# Patient Record
Sex: Female | Born: 1995 | Marital: Single | Smoking: Never smoker
Health system: Southern US, Community
[De-identification: ages and names within clinical notes are randomized; demographics above are authoritative.]

---

## 2017-02-19 DIAGNOSIS — B351 Tinea unguium: Secondary | ICD-10-CM | POA: Diagnosis not present

## 2017-02-19 DIAGNOSIS — B353 Tinea pedis: Secondary | ICD-10-CM | POA: Diagnosis not present

## 2018-01-02 DIAGNOSIS — J0100 Acute maxillary sinusitis, unspecified: Secondary | ICD-10-CM | POA: Diagnosis not present

## 2018-04-03 DIAGNOSIS — R51 Headache: Secondary | ICD-10-CM | POA: Diagnosis not present

## 2018-04-03 DIAGNOSIS — K629 Disease of anus and rectum, unspecified: Secondary | ICD-10-CM | POA: Diagnosis not present

## 2018-04-20 DIAGNOSIS — N926 Irregular menstruation, unspecified: Secondary | ICD-10-CM | POA: Diagnosis not present

## 2018-08-29 DIAGNOSIS — B373 Candidiasis of vulva and vagina: Secondary | ICD-10-CM | POA: Diagnosis not present

## 2018-11-12 DIAGNOSIS — Z6823 Body mass index (BMI) 23.0-23.9, adult: Secondary | ICD-10-CM | POA: Diagnosis not present

## 2018-11-12 DIAGNOSIS — K30 Functional dyspepsia: Secondary | ICD-10-CM | POA: Diagnosis not present

## 2020-04-06 ENCOUNTER — Other Ambulatory Visit (HOSPITAL_COMMUNITY)
Admission: RE | Admit: 2020-04-06 | Discharge: 2020-04-06 | Disposition: A | Discharge status: Home / Self Care | Payer: Medicaid Other | Source: Ambulatory Visit | Attending: Family Medicine | Admitting: Family Medicine

## 2020-04-06 ENCOUNTER — Encounter: Payer: Self-pay | Admitting: Family Medicine

## 2020-04-06 ENCOUNTER — Other Ambulatory Visit: Payer: Self-pay

## 2020-04-06 ENCOUNTER — Ambulatory Visit (INDEPENDENT_AMBULATORY_CARE_PROVIDER_SITE_OTHER): Payer: Medicaid Other | Admitting: Family Medicine

## 2020-04-06 VITALS — BP 109/58 | HR 78 | Ht 59.0 in | Wt 125.0 lb

## 2020-04-06 DIAGNOSIS — Z3400 Encounter for supervision of normal first pregnancy, unspecified trimester: Secondary | ICD-10-CM | POA: Insufficient documentation

## 2020-04-06 DIAGNOSIS — Z3A16 16 weeks gestation of pregnancy: Secondary | ICD-10-CM | POA: Insufficient documentation

## 2020-04-06 DIAGNOSIS — Z3402 Encounter for supervision of normal first pregnancy, second trimester: Secondary | ICD-10-CM

## 2020-04-06 LAB — POCT URINALYSIS DIPSTICK
Blood, UA: NEGATIVE
Glucose, UA: NEGATIVE
Ketones, UA: NEGATIVE
Leukocytes, UA: NEGATIVE
Nitrite, UA: NEGATIVE
Protein, UA: NEGATIVE
Spec Grav, UA: 1.015 (ref 1.010–1.025)
pH, UA: 6 (ref 5.0–8.0)

## 2020-04-06 NOTE — Progress Notes (Signed)
Subjective:  Jacqueline Booker is a G1P0 [redacted]w[redacted]d being seen today for her first obstetrical visit.  Her obstetrical history is significant for first pregnancy. Patient does intend to breast feed. Pregnancy history fully reviewed.  Patient reports no complaints.  BP (!) 109/58   Pulse 78   Ht 4\' 11"  (1.499 m)   Wt 125 lb 0.6 oz (56.7 kg)   LMP 12/26/2019 (Within Days)   BMI 25.26 kg/m   HISTORY: OB History  Gravida Para Term Preterm AB Living  1            SAB TAB Ectopic Multiple Live Births               # Outcome Date GA Lbr Len/2nd Weight Sex Delivery Anes PTL Lv  1 Current             History reviewed. No pertinent past medical history.  History reviewed. No pertinent surgical history.  Family History  Problem Relation Age of Onset  . High Cholesterol Mother   . High Cholesterol Father      Exam  BP (!) 109/58   Pulse 78   Ht 4\' 11"  (1.499 m)   Wt 125 lb 0.6 oz (56.7 kg)   LMP 12/26/2019 (Within Days)   BMI 25.26 kg/m   Chaperone present during exam  CONSTITUTIONAL: Well-developed, well-nourished female in no acute distress.  HENT:  Normocephalic, atraumatic, External right and left ear normal. Oropharynx is clear and moist EYES: Conjunctivae and EOM are normal. Pupils are equal, round, and reactive to light. No scleral icterus.  NECK: Normal range of motion, supple, no masses.  Normal thyroid.  CARDIOVASCULAR: Normal heart rate noted, regular rhythm RESPIRATORY: Clear to auscultation bilaterally. Effort and breath sounds normal, no problems with respiration noted. BREASTS: Symmetric in size. No masses, skin changes, nipple drainage, or lymphadenopathy. ABDOMEN: Soft, normal bowel sounds, no distention noted.  No tenderness, rebound or guarding.  PELVIC: Normal appearing external genitalia; normal appearing vaginal mucosa and cervix. No abnormal discharge noted. Normal uterine size, no other palpable masses, no uterine or adnexal tenderness. MUSCULOSKELETAL:  Normal range of motion. No tenderness.  No cyanosis, clubbing, or edema.  2+ distal pulses. SKIN: Skin is warm and dry. No rash noted. Not diaphoretic. No erythema. No pallor. NEUROLOGIC: Alert and oriented to person, place, and time. Normal reflexes, muscle tone coordination. No cranial nerve deficit noted. PSYCHIATRIC: Normal mood and affect. Normal behavior. Normal judgment and thought content.    Assessment:    Pregnancy: G1P0 Patient Active Problem List   Diagnosis Date Noted  . Supervision of normal first pregnancy, antepartum 04/06/2020      Plan:   1. [redacted] weeks gestation of pregnancy - CBC/D/Plt+RPR+Rh+ABO+Rub Ab... - Hemoglobpathy+Fer w/A Thal Rfx - Culture, OB Urine - 02/25/2020 MFM OB COMP + 14 WK; Future - SMN1 COPY NUMBER ANALYSIS (SMA Carrier Screen) - [facility] AMB BABYSCRIPTS SCHEDULE OPTIMIZATION - POCT urinalysis dipstick - Cytology - PAP( [place]) - AFP, Serum, Open Spina Bifida - Genetic Screening  2. Supervision of normal first pregnancy, antepartum FHT normal. Fundal height around 16cm.  Discussed practice arrangement, delivery at [facility] Desires panorama  - CBC/D/Plt+RPR+Rh+ABO+Rub Ab... - Hemoglobpathy+Fer w/A Thal Rfx - Culture, OB Urine - [place] MFM OB COMP + 14 WK; Future - SMN1 COPY NUMBER ANALYSIS (SMA Carrier Screen) - CHL AMB BABYSCRIPTS SCHEDULE OPTIMIZATION - POCT urinalysis dipstick - Cytology - PAP( [place]) - AFP, Serum, Open Spina Bifida - Genetic Screening  Problem list reviewed and updated. 75% of 30 min visit spent on counseling and coordination of care.     Levie Heritage 04/06/2020

## 2020-04-08 LAB — CULTURE, OB URINE

## 2020-04-08 LAB — URINE CULTURE, OB REFLEX

## 2020-04-12 LAB — CYTOLOGY - PAP
Chlamydia: NEGATIVE
Comment: NEGATIVE
Comment: NEGATIVE
Comment: NORMAL
Diagnosis: UNDETERMINED — AB
High risk HPV: NEGATIVE
Neisseria Gonorrhea: NEGATIVE

## 2020-04-13 ENCOUNTER — Telehealth: Payer: Self-pay

## 2020-04-13 NOTE — Telephone Encounter (Signed)
Pt made aware that her Panorama was normal.Pt does not want to know the gender. A copy of the results was put in a envelope for the pt. Pt also made aware that her Pap smear showed ASCUS and she will need to repeat her pap in 1 year. Understanding was voiced.  Wah Sabic l Leelah Hanna, CMA

## 2020-04-13 NOTE — Telephone Encounter (Signed)
Called pt to discuss Panorama results. Left message for pt to call the office back. °Delesha Pohlman l Khaila Velarde, CMA  °

## 2020-04-14 LAB — CBC/D/PLT+RPR+RH+ABO+RUB AB...
Antibody Screen: NEGATIVE
Basophils Absolute: 0 10*3/uL (ref 0.0–0.2)
Basos: 0 %
EOS (ABSOLUTE): 0 10*3/uL (ref 0.0–0.4)
Eos: 0 %
HCV Ab: <0.1 s/co ratio (ref 0.0–0.9)
HIV Screen 4th Generation wRfx: NONREACTIVE
Hematocrit: 37.9 % (ref 34.0–46.6)
Hemoglobin: 12.8 g/dL (ref 11.1–15.9)
Hepatitis B Surface Ag: NEGATIVE
Immature Grans (Abs): 0.1 10*3/uL (ref 0.0–0.1)
Immature Granulocytes: 1 %
Lymphocytes Absolute: 2.4 10*3/uL (ref 0.7–3.1)
Lymphs: 28 %
MCH: 29.6 pg (ref 26.6–33.0)
MCHC: 33.8 g/dL (ref 31.5–35.7)
MCV: 88 fL (ref 79–97)
Monocytes Absolute: 0.6 10*3/uL (ref 0.1–0.9)
Monocytes: 7 %
Neutrophils Absolute: 5.3 10*3/uL (ref 1.4–7.0)
Neutrophils: 64 %
Platelets: 244 10*3/uL (ref 150–450)
RBC: 4.32 x10E6/uL (ref 3.77–5.28)
RDW: 13.5 % (ref 11.7–15.4)
RPR Ser Ql: NONREACTIVE
Rh Factor: POSITIVE
Rubella Antibodies, IGG: 3.92 index (ref >0.99)
WBC: 8.4 10*3/uL (ref 3.4–10.8)

## 2020-04-14 LAB — SMN1 COPY NUMBER ANALYSIS (SMA CARRIER SCREENING)

## 2020-04-14 LAB — HEMOGLOBPATHY+FER W/A THAL RFX
Ferritin: 22 ng/mL (ref 15–150)
Hgb A2: 3 % (ref 1.8–3.2)
Hgb A: 97 % (ref 96.4–98.8)
Hgb F: 0 % (ref 0.0–2.0)
Hgb S: 0 %

## 2020-04-14 LAB — HCV INTERPRETATION

## 2020-04-14 LAB — AFP, SERUM, OPEN SPINA BIFIDA
AFP MoM: 1.05
AFP Value: 42.3 ng/mL
Gest. Age on Collection Date: 16.3 weeks
Maternal Age At EDD: 24.9 yr
OSBR Risk 1 IN: 10000
Test Results:: NEGATIVE
Weight: 125 [lb_av]

## 2020-05-03 ENCOUNTER — Ambulatory Visit: Payer: Medicaid Other

## 2020-05-03 ENCOUNTER — Other Ambulatory Visit: Payer: Self-pay

## 2020-05-03 ENCOUNTER — Other Ambulatory Visit: Payer: Self-pay | Admitting: Family Medicine

## 2020-05-03 DIAGNOSIS — Z3689 Encounter for other specified antenatal screening: Secondary | ICD-10-CM

## 2020-05-04 ENCOUNTER — Encounter: Payer: Medicaid Other | Admitting: Family Medicine

## 2020-05-04 ENCOUNTER — Ambulatory Visit (INDEPENDENT_AMBULATORY_CARE_PROVIDER_SITE_OTHER): Payer: Medicaid Other | Admitting: Family Medicine

## 2020-05-04 VITALS — BP 112/58 | HR 79

## 2020-05-04 DIAGNOSIS — Z3A20 20 weeks gestation of pregnancy: Secondary | ICD-10-CM

## 2020-05-04 DIAGNOSIS — Z3400 Encounter for supervision of normal first pregnancy, unspecified trimester: Secondary | ICD-10-CM

## 2020-05-04 NOTE — Progress Notes (Signed)
   PRENATAL VISIT NOTE  Subjective:  Jacqueline Booker is a 24 y.o. G1P0 at [redacted]w[redacted]d being seen today for ongoing prenatal care.  She is currently monitored for the following issues for this low-risk pregnancy and has Supervision of normal first pregnancy, antepartum on their problem list.  Patient reports no complaints.  Contractions: Not present. Vag. Bleeding: None.  Movement: Present. Denies leaking of fluid.   The following portions of the patient's history were reviewed and updated as appropriate: allergies, current medications, past family history, past medical history, past social history, past surgical history and problem list.   Objective:   Vitals:   05/04/20 1307  BP: (!) 112/58  Pulse: 79    Fetal Status: Fetal Heart Rate (bpm): 140   Movement: Present     General:  Alert, oriented and cooperative. Patient is in no acute distress.  Skin: Skin is warm and dry. No rash noted.   Cardiovascular: Normal heart rate noted  Respiratory: Normal respiratory effort, no problems with respiration noted  Abdomen: Soft, gravid, appropriate for gestational age.  Pain/Pressure: Present     Pelvic: Cervical exam deferred        Extremities: Normal range of motion.  Edema: None  Mental Status: Normal mood and affect. Normal behavior. Normal judgment and thought content.   Assessment and Plan:  Pregnancy: G1P0 at [redacted]w[redacted]d 1. Supervision of normal first pregnancy, antepartum FHT and FH normal  2. [redacted] weeks gestation of pregnancy  Preterm labor symptoms and general obstetric precautions including but not limited to vaginal bleeding, contractions, leaking of fluid and fetal movement were reviewed in detail with the patient. Please refer to After Visit Summary for other counseling recommendations.   No follow-ups on file.  Future Appointments  Date Time Provider Department Center  05/10/2020  2:45 PM [facility] US4 [facility]  06/01/2020  3:30 PM Levie Heritage, DO [facility] None    Levie Heritage, DO

## 2020-05-10 ENCOUNTER — Ambulatory Visit: Payer: Medicaid Other | Attending: Family Medicine

## 2020-05-10 ENCOUNTER — Other Ambulatory Visit: Payer: Self-pay

## 2020-05-10 DIAGNOSIS — Z3689 Encounter for other specified antenatal screening: Secondary | ICD-10-CM

## 2020-05-11 ENCOUNTER — Other Ambulatory Visit: Payer: Self-pay | Admitting: *Deleted

## 2020-05-11 DIAGNOSIS — Z362 Encounter for other antenatal screening follow-up: Secondary | ICD-10-CM

## 2020-06-01 ENCOUNTER — Ambulatory Visit (INDEPENDENT_AMBULATORY_CARE_PROVIDER_SITE_OTHER): Payer: Medicaid Other | Admitting: Family Medicine

## 2020-06-01 VITALS — BP 117/65 | HR 84 | Wt 137.0 lb

## 2020-06-01 DIAGNOSIS — Z3A24 24 weeks gestation of pregnancy: Secondary | ICD-10-CM

## 2020-06-01 DIAGNOSIS — Z3400 Encounter for supervision of normal first pregnancy, unspecified trimester: Secondary | ICD-10-CM

## 2020-06-01 NOTE — Progress Notes (Signed)
   PRENATAL VISIT NOTE  Subjective:  Jacqueline Booker is a 24 y.o. G1P0 at [redacted]w[redacted]d being seen today for ongoing prenatal care.  She is currently monitored for the following issues for this low-risk pregnancy and has Supervision of normal first pregnancy, antepartum on their problem list.  Patient reports no complaints.  Contractions: Not present. Vag. Bleeding: None.  Movement: Present. Denies leaking of fluid.   The following portions of the patient's history were reviewed and updated as appropriate: allergies, current medications, past family history, past medical history, past social history, past surgical history and problem list.   Objective:   Vitals:   06/01/20 1537  BP: 117/65  Pulse: 84  Weight: 137 lb (62.1 kg)    Fetal Status: Fetal Heart Rate (bpm): 142 Fundal Height: 24 cm Movement: Present     General:  Alert, oriented and cooperative. Patient is in no acute distress.  Skin: Skin is warm and dry. No rash noted.   Cardiovascular: Normal heart rate noted  Respiratory: Normal respiratory effort, no problems with respiration noted  Abdomen: Soft, gravid, appropriate for gestational age.  Pain/Pressure: Present     Pelvic: Cervical exam deferred        Extremities: Normal range of motion.  Edema: None  Mental Status: Normal mood and affect. Normal behavior. Normal judgment and thought content.   Assessment and Plan:  Pregnancy: G1P0 at [redacted]w[redacted]d 1. [redacted] weeks gestation of pregnancy 2. Supervision of normal first pregnancy, antepartum FHT and FH normal  Preterm labor symptoms and general obstetric precautions including but not limited to vaginal bleeding, contractions, leaking of fluid and fetal movement were reviewed in detail with the patient. Please refer to After Visit Summary for other counseling recommendations.   Return in about 4 weeks (around 06/29/2020) for 2 hr GTT, OB f/u.  Future Appointments  Date Time Provider Department Center  06/07/2020  3:45 PM [facility] US4  [facility]    Levie Heritage, DO

## 2020-06-07 ENCOUNTER — Ambulatory Visit: Payer: 59 | Admitting: *Deleted

## 2020-06-07 ENCOUNTER — Other Ambulatory Visit: Payer: Self-pay

## 2020-06-07 ENCOUNTER — Ambulatory Visit: Payer: 59 | Attending: Obstetrics and Gynecology

## 2020-06-07 DIAGNOSIS — Z3A25 25 weeks gestation of pregnancy: Secondary | ICD-10-CM

## 2020-06-07 DIAGNOSIS — Z3400 Encounter for supervision of normal first pregnancy, unspecified trimester: Secondary | ICD-10-CM | POA: Diagnosis present

## 2020-06-07 DIAGNOSIS — Z362 Encounter for other antenatal screening follow-up: Secondary | ICD-10-CM | POA: Diagnosis present

## 2020-06-14 ENCOUNTER — Telehealth: Payer: Self-pay

## 2020-06-14 NOTE — Telephone Encounter (Signed)
Pt called stating she felt dizzy and faint. Pt states her BP is fine. Pt denies seeing spots. Pt states she has had dizzy episodes before she became pregnant. Advised pt to go to [facility] if she has another episode today. Understanding was voiced. Yu Cragun l Zafiro Routson, CMA

## 2020-06-29 ENCOUNTER — Encounter: Payer: Medicaid Other | Admitting: Family Medicine

## 2020-06-30 ENCOUNTER — Ambulatory Visit (INDEPENDENT_AMBULATORY_CARE_PROVIDER_SITE_OTHER): Payer: 59 | Admitting: Family Medicine

## 2020-06-30 ENCOUNTER — Other Ambulatory Visit: Payer: Self-pay

## 2020-06-30 VITALS — BP 124/63 | HR 77 | Wt 136.0 lb

## 2020-06-30 DIAGNOSIS — Z23 Encounter for immunization: Secondary | ICD-10-CM | POA: Diagnosis not present

## 2020-06-30 DIAGNOSIS — Z3A28 28 weeks gestation of pregnancy: Secondary | ICD-10-CM

## 2020-06-30 DIAGNOSIS — Z3400 Encounter for supervision of normal first pregnancy, unspecified trimester: Secondary | ICD-10-CM

## 2020-06-30 NOTE — Progress Notes (Signed)
   PRENATAL VISIT NOTE  Subjective:  Jacqueline Booker is a 24 y.o. G1P0 at [redacted]w[redacted]d being seen today for ongoing prenatal care.  She is currently monitored for the following issues for this low-risk pregnancy and has Supervision of normal first pregnancy, antepartum on their problem list.  Patient reports mood irritation. Bleeding hemorrhoids.  Contractions: Not present. Vag. Bleeding: None.  Movement: Present. Denies leaking of fluid.   The following portions of the patient's history were reviewed and updated as appropriate: allergies, current medications, past family history, past medical history, past social history, past surgical history and problem list.   Objective:   Vitals:   06/30/20 0939  BP: 124/63  Pulse: 77  Weight: 136 lb (61.7 kg)    Fetal Status: Fetal Heart Rate (bpm): 135 Fundal Height: 27 cm Movement: Present     General:  Alert, oriented and cooperative. Patient is in no acute distress.  Skin: Skin is warm and dry. No rash noted.   Cardiovascular: Normal heart rate noted  Respiratory: Normal respiratory effort, no problems with respiration noted  Abdomen: Soft, gravid, appropriate for gestational age.  Pain/Pressure: Present     Pelvic: Cervical exam deferred        Extremities: Normal range of motion.  Edema: None  Mental Status: Normal mood and affect. Normal behavior. Normal judgment and thought content.   Assessment and Plan:  Pregnancy: G1P0 at [redacted]w[redacted]d 1. Supervision of normal first pregnancy, antepartum FHT and FH normal. 28 week labs Preparation H for hemorrhoids Discussed mood changes. Patient okay for now. Will notify if worsens. - Glucose Tolerance, 2 Hours w/1 Hour - CBC - HIV antibody (with reflex) - RPR  2. [redacted] weeks gestation of pregnancy - Glucose Tolerance, 2 Hours w/1 Hour - CBC - HIV antibody (with reflex) - RPR  Preterm labor symptoms and general obstetric precautions including but not limited to vaginal bleeding, contractions, leaking of  fluid and fetal movement were reviewed in detail with the patient. Please refer to After Visit Summary for other counseling recommendations.   Return in about 2 weeks (around 07/14/2020).  Future Appointments  Date Time Provider Department Center  07/10/2020 11:00 AM Adam Phenix, MD [facility] None    Levie Heritage, DO

## 2020-06-30 NOTE — Progress Notes (Signed)
Patient doing 2 hr gtt and 28 week  labs today. Armandina Stammer RN

## 2020-07-01 LAB — CBC
Hematocrit: 34.9 % (ref 34.0–46.6)
Hemoglobin: 11.6 g/dL (ref 11.1–15.9)
MCH: 28.5 pg (ref 26.6–33.0)
MCHC: 33.2 g/dL (ref 31.5–35.7)
MCV: 86 fL (ref 79–97)
Platelets: 236 10*3/uL (ref 150–450)
RBC: 4.07 x10E6/uL (ref 3.77–5.28)
RDW: 11.8 % (ref 11.7–15.4)
WBC: 7.1 10*3/uL (ref 3.4–10.8)

## 2020-07-01 LAB — GLUCOSE TOLERANCE, 2 HOURS W/ 1HR
Glucose, 1 hour: 135 mg/dL (ref 65–179)
Glucose, 2 hour: 121 mg/dL (ref 65–152)
Glucose, Fasting: 69 mg/dL (ref 65–91)

## 2020-07-01 LAB — RPR: RPR Ser Ql: NONREACTIVE

## 2020-07-01 LAB — HIV ANTIBODY (ROUTINE TESTING W REFLEX): HIV Screen 4th Generation wRfx: NONREACTIVE

## 2020-07-10 ENCOUNTER — Ambulatory Visit (INDEPENDENT_AMBULATORY_CARE_PROVIDER_SITE_OTHER): Payer: 59 | Admitting: Obstetrics & Gynecology

## 2020-07-10 ENCOUNTER — Other Ambulatory Visit: Payer: Self-pay

## 2020-07-10 VITALS — BP 110/61 | HR 87 | Wt 140.0 lb

## 2020-07-10 DIAGNOSIS — Z3400 Encounter for supervision of normal first pregnancy, unspecified trimester: Secondary | ICD-10-CM

## 2020-07-10 DIAGNOSIS — Z3A30 30 weeks gestation of pregnancy: Secondary | ICD-10-CM

## 2020-07-10 NOTE — Progress Notes (Signed)
   PRENATAL VISIT NOTE  Subjective:  Jacqueline Booker is a 24 y.o. G1P0 at [redacted]w[redacted]d being seen today for ongoing prenatal care.  She is currently monitored for the following issues for this low-risk pregnancy and has Supervision of normal first pregnancy, antepartum on their problem list.  Patient reports she has cravings for dust and dirt but has resisted ingesting this.  Contractions: Not present. Vag. Bleeding: None.  Movement: Present. Denies leaking of fluid.   The following portions of the patient's history were reviewed and updated as appropriate: allergies, current medications, past family history, past medical history, past social history, past surgical history and problem list.   Objective:   Vitals:   07/10/20 1105  BP: 110/61  Pulse: 87  Weight: 140 lb (63.5 kg)    Fetal Status: Fetal Heart Rate (bpm): 136   Movement: Present     General:  Alert, oriented and cooperative. Patient is in no acute distress.  Skin: Skin is warm and dry. No rash noted.   Cardiovascular: Normal heart rate noted  Respiratory: Normal respiratory effort, no problems with respiration noted  Abdomen: Soft, gravid, appropriate for gestational age.  Pain/Pressure: Absent     Pelvic: Cervical exam deferred        Extremities: Normal range of motion.  Edema: None  Mental Status: Normal mood and affect. Normal behavior. Normal judgment and thought content.   Assessment and Plan:  Pregnancy: G1P0 at [redacted]w[redacted]d 1. [redacted] weeks gestation of pregnancy At risk for pica CBC    Component Value Date/Time   WBC 7.1 06/30/2020 1003   RBC 4.07 06/30/2020 1003   HGB 11.6 06/30/2020 1003   HCT 34.9 06/30/2020 1003   PLT 236 06/30/2020 1003   MCV 86 06/30/2020 1003   MCH 28.5 06/30/2020 1003   MCHC 33.2 06/30/2020 1003   RDW 11.8 06/30/2020 1003   LYMPHSABS 2.4 04/06/2020 1422   EOSABS 0.0 04/06/2020 1422   BASOSABS 0.0 04/06/2020 1422     2. Supervision of normal first pregnancy, antepartum Had nl f/u US last  mo.  Preterm labor symptoms and general obstetric precautions including but not limited to vaginal bleeding, contractions, leaking of fluid and fetal movement were reviewed in detail with the patient. Please refer to After Visit Summary for other counseling recommendations.   Return in about 2 weeks (around 07/24/2020).  No future appointments.  Scheryl Darter, MD

## 2020-07-10 NOTE — Patient Instructions (Signed)

## 2020-07-24 ENCOUNTER — Encounter: Payer: Self-pay | Admitting: Obstetrics & Gynecology

## 2020-07-24 ENCOUNTER — Other Ambulatory Visit: Payer: Self-pay

## 2020-07-24 ENCOUNTER — Encounter: Payer: 59 | Admitting: Obstetrics & Gynecology

## 2020-07-24 ENCOUNTER — Ambulatory Visit (INDEPENDENT_AMBULATORY_CARE_PROVIDER_SITE_OTHER): Payer: 59 | Admitting: Obstetrics & Gynecology

## 2020-07-24 VITALS — BP 117/69 | HR 87 | Wt 143.0 lb

## 2020-07-24 DIAGNOSIS — Z3400 Encounter for supervision of normal first pregnancy, unspecified trimester: Secondary | ICD-10-CM

## 2020-07-24 DIAGNOSIS — Z3A32 32 weeks gestation of pregnancy: Secondary | ICD-10-CM

## 2020-07-24 NOTE — Progress Notes (Signed)
   PRENATAL VISIT NOTE  Subjective:  Jacqueline Booker is a 24 y.o. G1P0 at [redacted]w[redacted]d being seen today for ongoing prenatal care.  She is currently monitored for the following issues for this low-risk pregnancy and has Supervision of normal first pregnancy, antepartum on their problem list.  Patient reports no complaints.  Contractions: Not present. Vag. Bleeding: None.  Movement: Present. Denies leaking of fluid.   The following portions of the patient's history were reviewed and updated as appropriate: allergies, current medications, past family history, past medical history, past social history, past surgical history and problem list.   Objective:   Vitals:   07/24/20 1023  BP: 117/69  Pulse: 87  Weight: 143 lb (64.9 kg)    Fetal Status: Fetal Heart Rate (bpm): 130 Fundal Height: 32 cm Movement: Present     General:  Alert, oriented and cooperative. Patient is in no acute distress.  Skin: Skin is warm and dry. No rash noted.   Cardiovascular: Normal heart rate noted  Respiratory: Normal respiratory effort, no problems with respiration noted  Abdomen: Soft, gravid, appropriate for gestational age.  Pain/Pressure: Present     Pelvic: Cervical exam deferred        Extremities: Normal range of motion.  Edema: None  Mental Status: Normal mood and affect. Normal behavior. Normal judgment and thought content.   Assessment and Plan:  Pregnancy: G1P0 at 104w0d 1. [redacted] weeks gestation of pregnancy 2. Supervision of normal first pregnancy, antepartum No concerns. Doing well on Babyscripts. Preterm labor symptoms and general obstetric precautions including but not limited to vaginal bleeding, contractions, leaking of fluid and fetal movement were reviewed in detail with the patient. Please refer to After Visit Summary for other counseling recommendations.   Return in about 4 weeks (around 08/21/2020) for Pelvic cultures, OFFICE 36 week Babyscripts OB VISIT (MD or APP).  No future  appointments.  Jaynie Collins, MD

## 2020-07-24 NOTE — Patient Instructions (Signed)
Return to office for any scheduled appointments. Call the office or go to the MAU at [facility] [place] if:  You begin to have strong, frequent contractions  Your water breaks.  Sometimes it is a big gush of fluid, sometimes it is just a trickle that keeps getting your panties wet or running down your legs  You have vaginal bleeding.  It is normal to have a small amount of spotting if your cervix was checked.   You do not feel your baby moving like normal.  If you do not, get something to eat and drink and lay down and focus on feeling your baby move.   If your baby is still not moving like normal, you should call the office or go to MAU.  Any other obstetric concerns.   

## 2020-08-16 ENCOUNTER — Ambulatory Visit: Payer: 59

## 2020-08-17 ENCOUNTER — Other Ambulatory Visit (HOSPITAL_COMMUNITY)
Admission: RE | Admit: 2020-08-17 | Discharge: 2020-08-17 | Disposition: A | Discharge status: Home / Self Care | Payer: 59 | Source: Ambulatory Visit | Attending: Family Medicine | Admitting: Family Medicine

## 2020-08-17 ENCOUNTER — Ambulatory Visit (INDEPENDENT_AMBULATORY_CARE_PROVIDER_SITE_OTHER): Payer: 59 | Admitting: Family Medicine

## 2020-08-17 ENCOUNTER — Other Ambulatory Visit: Payer: Self-pay

## 2020-08-17 ENCOUNTER — Encounter: Payer: Self-pay | Admitting: Family Medicine

## 2020-08-17 ENCOUNTER — Encounter: Payer: 59 | Admitting: Family Medicine

## 2020-08-17 VITALS — BP 118/67 | HR 83 | Wt 148.0 lb

## 2020-08-17 DIAGNOSIS — Z3685 Encounter for antenatal screening for Streptococcus B: Secondary | ICD-10-CM | POA: Diagnosis not present

## 2020-08-17 DIAGNOSIS — Z3400 Encounter for supervision of normal first pregnancy, unspecified trimester: Secondary | ICD-10-CM

## 2020-08-17 DIAGNOSIS — Z3A35 35 weeks gestation of pregnancy: Secondary | ICD-10-CM | POA: Insufficient documentation

## 2020-08-17 NOTE — Progress Notes (Signed)
   PRENATAL VISIT NOTE  Subjective:  Jacqueline Booker is a 24 y.o. G1P0 at [redacted]w[redacted]d being seen today for ongoing prenatal care.  She is currently monitored for the following issues for this low-risk pregnancy and has Supervision of normal first pregnancy, antepartum on their problem list.  Patient reports vaginal discharge earlier this week - its going away now..  Contractions: Irregular. Vag. Bleeding: None.  Movement: Present. Denies leaking of fluid.   The following portions of the patient's history were reviewed and updated as appropriate: allergies, current medications, past family history, past medical history, past social history, past surgical history and problem list.   Objective:   Vitals:   08/17/20 1506  BP: 118/67  Pulse: 83  Weight: 148 lb (67.1 kg)    Fetal Status: Fetal Heart Rate (bpm): 123 Fundal Height: 35 cm Movement: Present  Presentation: Vertex  General:  Alert, oriented and cooperative. Patient is in no acute distress.  Skin: Skin is warm and dry. No rash noted.   Cardiovascular: Normal heart rate noted  Respiratory: Normal respiratory effort, no problems with respiration noted  Abdomen: Soft, gravid, appropriate for gestational age.  Pain/Pressure: Present     Pelvic: Cervical exam performed in the presence of a chaperone Dilation: 1.5 Effacement (%): Thick Station: -3  Extremities: Normal range of motion.  Edema: Trace  Mental Status: Normal mood and affect. Normal behavior. Normal judgment and thought content.   Assessment and Plan:  Pregnancy: G1P0 at [redacted]w[redacted]d 1. [redacted] weeks gestation of pregnancy - Cervicovaginal ancillary only( [place]) - Culture, beta strep (group b only)  2. Supervision of normal first pregnancy, antepartum FHT and FH normal  Preterm labor symptoms and general obstetric precautions including but not limited to vaginal bleeding, contractions, leaking of fluid and fetal movement were reviewed in detail with the patient. Please refer to  After Visit Summary for other counseling recommendations.   Return in about 2 weeks (around 08/31/2020) for OB f/u.  No future appointments.  Levie Heritage, DO

## 2020-08-21 LAB — CULTURE, BETA STREP (GROUP B ONLY): Strep Gp B Culture: NEGATIVE

## 2020-08-22 LAB — CERVICOVAGINAL ANCILLARY ONLY
Chlamydia: NEGATIVE
Comment: NEGATIVE
Comment: NORMAL
Neisseria Gonorrhea: NEGATIVE

## 2020-08-23 ENCOUNTER — Encounter: Payer: 59 | Admitting: Family Medicine

## 2020-08-26 NOTE — L&D Delivery Note (Addendum)
Delivery Note Called to bedside and patient complete and pushing. At 2:39 PM a viable female was delivered via Vaginal, Spontaneous (Presentation: Left Occiput Anterior). No nuchal cord noted at delivery.  APGAR: 3, 4; weight 6 lb 7.2 oz (2925 g). Waited 1 min and cord clamped and cut, infant then handed over to the warmer to NICU team. Placenta status: Spontaneous, Intact.  Cord: 3 vessels with the following complications: None.  Cord pH: pending.  Anesthesia: Epidural Episiotomy: None Lacerations: 2nd degree;L and R Sulcus; L Labial Suture Repair: 3.0 vicryl Est. Blood Loss (mL): 778  TXA administered post delivery due to oozing from laceration site. Fundus noted to be firm on exam.  Mom to postpartum.  Baby to NICU.  Cora Collum 09/20/2020, 4:14 PM  GME ATTESTATION:  I saw and evaluated the patient. I agree with the findings and the plan of care as documented in the resident's note.  Alric Seton, MD OB Fellow, [facility], Center for [facility] 09/20/2020 4:37 PM

## 2020-08-31 ENCOUNTER — Ambulatory Visit (INDEPENDENT_AMBULATORY_CARE_PROVIDER_SITE_OTHER): Payer: 59 | Admitting: Family Medicine

## 2020-08-31 ENCOUNTER — Other Ambulatory Visit: Payer: Self-pay

## 2020-08-31 VITALS — BP 124/55 | HR 81 | Wt 149.0 lb

## 2020-08-31 DIAGNOSIS — Z3400 Encounter for supervision of normal first pregnancy, unspecified trimester: Secondary | ICD-10-CM

## 2020-08-31 DIAGNOSIS — Z3A37 37 weeks gestation of pregnancy: Secondary | ICD-10-CM

## 2020-08-31 NOTE — Progress Notes (Signed)
   PRENATAL VISIT NOTE  Subjective:  Jacqueline Booker is a 25 y.o. G1P0 at [redacted]w[redacted]d being seen today for ongoing prenatal care.  She is currently monitored for the following issues for this low-risk pregnancy and has Supervision of normal first pregnancy, antepartum on their problem list.  Patient reports occasional contractions.  Contractions: Irregular. Vag. Bleeding: None.  Movement: Present. Denies leaking of fluid.   The following portions of the patient's history were reviewed and updated as appropriate: allergies, current medications, past family history, past medical history, past social history, past surgical history and problem list.   Objective:   Vitals:   08/31/20 1318  BP: (!) 124/55  Pulse: 81  Weight: 149 lb (67.6 kg)    Fetal Status: Fetal Heart Rate (bpm): 140   Movement: Present     General:  Alert, oriented and cooperative. Patient is in no acute distress.  Skin: Skin is warm and dry. No rash noted.   Cardiovascular: Normal heart rate noted  Respiratory: Normal respiratory effort, no problems with respiration noted  Abdomen: Soft, gravid, appropriate for gestational age.  Pain/Pressure: Present     Pelvic: Cervical exam deferred        Extremities: Normal range of motion.  Edema: Trace  Mental Status: Normal mood and affect. Normal behavior. Normal judgment and thought content.   Assessment and Plan:  Pregnancy: G1P0 at [redacted]w[redacted]d 1. Supervision of normal first pregnancy, antepartum FHT and FH normal  2. [redacted] weeks gestation of pregnancy   Term labor symptoms and general obstetric precautions including but not limited to vaginal bleeding, contractions, leaking of fluid and fetal movement were reviewed in detail with the patient. Please refer to After Visit Summary for other counseling recommendations.   Return in about 1 week (around 09/07/2020) for OB f/u.  No future appointments.  Levie Heritage, DO

## 2020-09-07 ENCOUNTER — Other Ambulatory Visit: Payer: Self-pay

## 2020-09-07 ENCOUNTER — Ambulatory Visit (INDEPENDENT_AMBULATORY_CARE_PROVIDER_SITE_OTHER): Payer: 59 | Admitting: Family Medicine

## 2020-09-07 VITALS — BP 115/64 | HR 96 | Wt 152.0 lb

## 2020-09-07 DIAGNOSIS — Z3A38 38 weeks gestation of pregnancy: Secondary | ICD-10-CM

## 2020-09-07 DIAGNOSIS — Z3400 Encounter for supervision of normal first pregnancy, unspecified trimester: Secondary | ICD-10-CM

## 2020-09-07 NOTE — Progress Notes (Signed)
   PRENATAL VISIT NOTE  Subjective:  Jacqueline Booker is a 25 y.o. G1P0 at [redacted]w[redacted]d being seen today for ongoing prenatal care.  She is currently monitored for the following issues for this low-risk pregnancy and has Supervision of normal first pregnancy, antepartum on their problem list.  Patient reports no complaints.  Contractions: Irregular. Vag. Bleeding: None.  Movement: Present. Denies leaking of fluid.   The following portions of the patient's history were reviewed and updated as appropriate: allergies, current medications, past family history, past medical history, past social history, past surgical history and problem list.   Objective:   Vitals:   09/07/20 1037  BP: 115/64  Pulse: 96  Weight: 152 lb (68.9 kg)    Fetal Status: Fetal Heart Rate (bpm): 125   Movement: Present     General:  Alert, oriented and cooperative. Patient is in no acute distress.  Skin: Skin is warm and dry. No rash noted.   Cardiovascular: Normal heart rate noted  Respiratory: Normal respiratory effort, no problems with respiration noted  Abdomen: Soft, gravid, appropriate for gestational age.  Pain/Pressure: Present     Pelvic: Cervical exam deferred        Extremities: Normal range of motion.  Edema: Trace  Mental Status: Normal mood and affect. Normal behavior. Normal judgment and thought content.   Assessment and Plan:  Pregnancy: G1P0 at [redacted]w[redacted]d 1. [redacted] weeks gestation of pregnancy  2. Supervision of normal first pregnancy, antepartum FHT and FH normal  Term labor symptoms and general obstetric precautions including but not limited to vaginal bleeding, contractions, leaking of fluid and fetal movement were reviewed in detail with the patient. Please refer to After Visit Summary for other counseling recommendations.   Return in about 1 week (around 09/14/2020) for OB f/u.  Future Appointments  Date Time Provider Department Center  09/13/2020  2:00 PM Jerene Bears, MD [facility] None    Levie Heritage, DO

## 2020-09-13 ENCOUNTER — Ambulatory Visit (INDEPENDENT_AMBULATORY_CARE_PROVIDER_SITE_OTHER): Payer: 59 | Admitting: Obstetrics & Gynecology

## 2020-09-13 ENCOUNTER — Other Ambulatory Visit: Payer: Self-pay | Admitting: Obstetrics & Gynecology

## 2020-09-13 ENCOUNTER — Other Ambulatory Visit: Payer: Self-pay

## 2020-09-13 VITALS — BP 117/71 | HR 75 | Wt 153.0 lb

## 2020-09-13 DIAGNOSIS — Z3A39 39 weeks gestation of pregnancy: Secondary | ICD-10-CM

## 2020-09-13 DIAGNOSIS — Z3400 Encounter for supervision of normal first pregnancy, unspecified trimester: Secondary | ICD-10-CM

## 2020-09-13 NOTE — Progress Notes (Signed)
   PRENATAL VISIT NOTE  Subjective:  Jacqueline Booker is a 25 y.o. G1P0 at [redacted]w[redacted]d being seen today for ongoing prenatal care.  She is currently monitored for the following issues for this low-risk pregnancy and has Supervision of normal first pregnancy, antepartum on their problem list.  Patient reports no complaints.  Contractions: Irregular. Vag. Bleeding: None.  Movement: Present. Denies leaking of fluid.   The following portions of the patient's history were reviewed and updated as appropriate: allergies, current medications, past family history, past medical history, past social history, past surgical history and problem list.   Objective:   Vitals:   09/13/20 1402  BP: 117/71  Pulse: 75  Weight: 153 lb (69.4 kg)    Fetal Status: Fetal Heart Rate (bpm): 122 Fundal Height: 38 cm Movement: Present  Presentation: Vertex  General:  Alert, oriented and cooperative. Patient is in no acute distress.  Skin: Skin is warm and dry. No rash noted.   Cardiovascular: Normal heart rate noted  Respiratory: Normal respiratory effort, no problems with respiration noted  Abdomen: Soft, gravid, appropriate for gestational age.  Pain/Pressure: Present     Pelvic: Cervical exam performed in the presence of a chaperone Dilation: 3 Effacement (%): 50 Station: -2  Extremities: Normal range of motion.  Edema: Trace  Mental Status: Normal mood and affect. Normal behavior. Normal judgment and thought content.   Assessment and Plan:  Pregnancy: G1P0 at [redacted]w[redacted]d 1. [redacted] weeks gestation of pregnancy - Continue PNV - Induction scheduled for 09/19/2020  2. Supervision of normal first pregnancy, antepartum  Term labor symptoms and general obstetric precautions including but not limited to vaginal bleeding, contractions, leaking of fluid and fetal movement were reviewed in detail with the patient. Please refer to After Visit Summary for other counseling recommendations.   No follow-ups on file.  Future  Appointments  Date Time Provider Department Center  09/19/2020  6:30 AM [facility] SCHED ROOM [facility] None  10/19/2020  2:00 PM Levie Heritage, DO [facility] None    Jerene Bears, MD

## 2020-09-14 ENCOUNTER — Telehealth (HOSPITAL_COMMUNITY): Payer: Self-pay | Admitting: *Deleted

## 2020-09-14 NOTE — Telephone Encounter (Signed)
Preadmission screen  

## 2020-09-15 ENCOUNTER — Encounter (HOSPITAL_COMMUNITY): Payer: Self-pay | Admitting: *Deleted

## 2020-09-15 ENCOUNTER — Telehealth (HOSPITAL_COMMUNITY): Payer: Self-pay | Admitting: *Deleted

## 2020-09-15 NOTE — Telephone Encounter (Signed)
Preadmission screen  

## 2020-09-16 ENCOUNTER — Encounter (HOSPITAL_COMMUNITY): Payer: Self-pay | Admitting: Obstetrics and Gynecology

## 2020-09-16 ENCOUNTER — Inpatient Hospital Stay (EMERGENCY_DEPARTMENT_HOSPITAL)
Admission: AD | Admit: 2020-09-16 | Discharge: 2020-09-16 | Disposition: A | Discharge status: Home / Self Care | Payer: 59 | Source: Ambulatory Visit | Attending: Obstetrics and Gynecology | Admitting: Obstetrics and Gynecology

## 2020-09-16 ENCOUNTER — Other Ambulatory Visit: Payer: Self-pay

## 2020-09-16 DIAGNOSIS — O99891 Other specified diseases and conditions complicating pregnancy: Secondary | ICD-10-CM | POA: Diagnosis not present

## 2020-09-16 DIAGNOSIS — O9A213 Injury, poisoning and certain other consequences of external causes complicating pregnancy, third trimester: Secondary | ICD-10-CM

## 2020-09-16 DIAGNOSIS — Z3A00 Weeks of gestation of pregnancy not specified: Secondary | ICD-10-CM | POA: Diagnosis not present

## 2020-09-16 DIAGNOSIS — Z3689 Encounter for other specified antenatal screening: Secondary | ICD-10-CM

## 2020-09-16 DIAGNOSIS — W19XXXA Unspecified fall, initial encounter: Secondary | ICD-10-CM

## 2020-09-16 DIAGNOSIS — O99214 Obesity complicating childbirth: Secondary | ICD-10-CM | POA: Diagnosis not present

## 2020-09-16 DIAGNOSIS — Z3400 Encounter for supervision of normal first pregnancy, unspecified trimester: Secondary | ICD-10-CM

## 2020-09-16 DIAGNOSIS — O26893 Other specified pregnancy related conditions, third trimester: Secondary | ICD-10-CM | POA: Diagnosis not present

## 2020-09-16 DIAGNOSIS — Z3A39 39 weeks gestation of pregnancy: Secondary | ICD-10-CM
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
## 2020-09-16 NOTE — Discharge Instructions (Signed)
Fetal Movement Counts Patient Name: ________________________________________________ Patient Due Date: ____________________  What is a fetal movement count? A fetal movement count is the number of times that you feel your baby move during a certain amount of time. This may also be called a fetal kick count. A fetal movement count is recommended for every pregnant woman. You may be asked to start counting fetal movements as early as week 28 of your pregnancy. Pay attention to when your baby is most active. You may notice your baby's sleep and wake cycles. You may also notice things that make your baby move more. You should do a fetal movement count:  When your baby is normally most active.  At the same time each day. A good time to count movements is while you are resting, after having something to eat and drink. How do I count fetal movements? 1. Find a quiet, comfortable area. Sit, or lie down on your side. 2. Write down the date, the start time and stop time, and the number of movements that you felt between those two times. Take this information with you to your health care visits. 3. Write down your start time when you feel the first movement. 4. Count kicks, flutters, swishes, rolls, and jabs. You should feel at least 10 movements. 5. You may stop counting after you have felt 10 movements, or if you have been counting for 2 hours. Write down the stop time. 6. If you do not feel 10 movements in 2 hours, contact your health care provider for further instructions. Your health care provider may want to do additional tests to assess your baby's well-being. Contact a health care provider if:  You feel fewer than 10 movements in 2 hours.  Your baby is not moving like he or she usually does. Date: ____________ Start time: ____________ Stop time: ____________ Movements: ____________ Date: ____________ Start time: ____________ Stop time: ____________ Movements: ____________ Date: ____________  Start time: ____________ Stop time: ____________ Movements: ____________ Date: ____________ Start time: ____________ Stop time: ____________ Movements: ____________ Date: ____________ Start time: ____________ Stop time: ____________ Movements: ____________ Date: ____________ Start time: ____________ Stop time: ____________ Movements: ____________ Date: ____________ Start time: ____________ Stop time: ____________ Movements: ____________ Date: ____________ Start time: ____________ Stop time: ____________ Movements: ____________ Date: ____________ Start time: ____________ Stop time: ____________ Movements: ____________ This information is not intended to replace advice given to you by your health care provider. Make sure you discuss any questions you have with your health care provider. Document Revised: 04/01/2019 Document Reviewed: 04/01/2019 Elsevier Patient Education  2021 [employer]        Placental Abruption  The placenta is the organ formed during pregnancy. It carries oxygen and nutrients to the unborn baby (fetus). The placenta is the baby's life support system. Normally, it remains attached to the inside of the uterus until the baby is born. Placental abruption is a condition in which the placenta partly or completely separates from the uterus before the baby is born. Placental abruption is rare, but it can happen any time after 20 weeks of pregnancy. A small separation may not cause problems, but a large separation may be dangerous for you and your baby. A large separation is usually an emergency that requires treatment right away. What are the causes? In most cases, the cause of this condition is not known. What increases the risk? This condition is more likely to develop in women who:  Have experienced a recent fall, an injury to the abdomen,  or a car accident.  Have had a previous placental abruption.  Have high blood pressure.  Smoke cigarettes, use alcohol, or use  drugs, such as cocaine.  Are pregnant with more than one baby, such as twins or triplets.  Are 22 years of age or older. What are the signs or symptoms? A small placental abruption may not cause symptoms, or it may cause mild symptoms, which may include:  Mild pain in the abdomen or lower back.  Slight bleeding in the vagina. A severe placental abruption will cause symptoms. The symptoms will depend on the size of the separation and the stage of pregnancy. These may include:  Severe pain in the abdomen or lower back.  Bleeding from the vagina.  Ongoing contractions of your uterus with little or no relaxation of your uterus between contractions. How is this diagnosed? There are no tests to diagnose placental abruption. However, your health care provider may suspect that you have this condition based on:  Your symptoms.  A physical exam.  An ultrasound.  Blood tests.  Urine test. How is this treated? Treatment for placental abruption depends on the severity of the condition.  Mild cases may be monitored through close observation. You may be admitted to the hospital during this time.  Severe cases may require emergency treatment. This may involve: ? Admission to the hospital. ? Emergency cesarean delivery of your baby. ? A blood transfusion or other fluids given through an IV. Follow these instructions at home: Activity  Get plenty of rest and sleep.  Do not have sex until your health care provider says it is okay. Lifestyle  Do not use drugs.  Do not drink alcohol.  Do not use tampons or douche unless your health care provider says it is okay.  Do not use any products that contain nicotine or tobacco. These products include cigarettes, chewing tobacco, and vaping devices, such as e-cigarettes. If you need help quitting, ask your health care provider General instructions  Take over-the-counter and prescription medicines only as told by your health care  provider.  Do not take any medicines that your health care provider has not approved.  Arrange for help at home so that you can get enough rest.  Keep all follow-up visits. This is important. Contact a health care provider if:  You have vaginal spotting.  You are having mild, regular contractions. Get help right away if:  You have moderate or heavy vaginal bleeding or spotting.  You have severe pain in the abdomen.  You have continuous uterine contractions.  You have a hard, tender uterus.  You have any type of trauma, such as an injury to the abdomen, a fall, or a car accident.  You do not feel the baby move, or the baby moves very little. Summary  Placental abruption is a condition in which the placenta partly or completely separates from the uterus before the baby is born.  Placental abruption is a medical emergency that requires treatment right away.  Contact a health care provider if you have vaginal bleeding, or if you have mild, regular contractions.  Get help right away if you have heavy vaginal bleeding, severe pain in the abdomen, continuous uterine contractions, or you do not feel the baby move.  Keep all follow-up visits. This is important. This information is not intended to replace advice given to you by your health care provider. Make sure you discuss any questions you have with your health care provider. Document Revised: 04/03/2020 Document Reviewed:  04/03/2020 Elsevier Patient Education  2021 [employer].

## 2020-09-18 ENCOUNTER — Other Ambulatory Visit: Payer: Self-pay | Admitting: Family Medicine

## 2020-09-18 ENCOUNTER — Other Ambulatory Visit (HOSPITAL_COMMUNITY): Payer: 59

## 2020-09-19 ENCOUNTER — Inpatient Hospital Stay (HOSPITAL_COMMUNITY): Payer: 59 | Admitting: Anesthesiology

## 2020-09-19 ENCOUNTER — Encounter (HOSPITAL_COMMUNITY): Payer: Self-pay | Admitting: Obstetrics & Gynecology

## 2020-09-19 ENCOUNTER — Inpatient Hospital Stay (HOSPITAL_COMMUNITY): Payer: 59

## 2020-09-19 ENCOUNTER — Other Ambulatory Visit: Payer: Self-pay

## 2020-09-19 ENCOUNTER — Inpatient Hospital Stay (HOSPITAL_COMMUNITY)
Admission: AD | Admit: 2020-09-19 | Discharge: 2020-09-22 | DRG: 806 | Disposition: A | Discharge status: Home / Self Care | Payer: 59 | Attending: Obstetrics & Gynecology | Admitting: Obstetrics & Gynecology

## 2020-09-19 DIAGNOSIS — O99214 Obesity complicating childbirth: Secondary | ICD-10-CM | POA: Diagnosis present

## 2020-09-19 DIAGNOSIS — Z20822 Contact with and (suspected) exposure to covid-19: Secondary | ICD-10-CM | POA: Diagnosis present

## 2020-09-19 DIAGNOSIS — O9081 Anemia of the puerperium: Secondary | ICD-10-CM | POA: Diagnosis not present

## 2020-09-19 DIAGNOSIS — R87610 Atypical squamous cells of undetermined significance on cytologic smear of cervix (ASC-US): Secondary | ICD-10-CM | POA: Diagnosis present

## 2020-09-19 DIAGNOSIS — E669 Obesity, unspecified: Secondary | ICD-10-CM | POA: Diagnosis present

## 2020-09-19 DIAGNOSIS — Z3A40 40 weeks gestation of pregnancy: Secondary | ICD-10-CM

## 2020-09-19 DIAGNOSIS — O26893 Other specified pregnancy related conditions, third trimester: Secondary | ICD-10-CM | POA: Diagnosis present

## 2020-09-19 DIAGNOSIS — D62 Acute posthemorrhagic anemia: Secondary | ICD-10-CM | POA: Diagnosis not present

## 2020-09-19 DIAGNOSIS — Z3490 Encounter for supervision of normal pregnancy, unspecified, unspecified trimester: Secondary | ICD-10-CM

## 2020-09-19 DIAGNOSIS — O480 Post-term pregnancy: Secondary | ICD-10-CM | POA: Diagnosis not present

## 2020-09-19 DIAGNOSIS — Z3400 Encounter for supervision of normal first pregnancy, unspecified trimester: Secondary | ICD-10-CM

## 2020-09-19 DIAGNOSIS — Z5189 Encounter for other specified aftercare: Secondary | ICD-10-CM

## 2020-09-19 LAB — CBC
HCT: 33.3 % — ABNORMAL LOW (ref 36.0–46.0)
Hemoglobin: 10 g/dL — ABNORMAL LOW (ref 12.0–15.0)
MCH: 23.8 pg — ABNORMAL LOW (ref 26.0–34.0)
MCHC: 30 g/dL (ref 30.0–36.0)
MCV: 79.1 fL — ABNORMAL LOW (ref 80.0–100.0)
Platelets: 169 10*3/uL (ref 150–400)
RBC: 4.21 MIL/uL (ref 3.87–5.11)
RDW: 15.2 % (ref 11.5–15.5)
WBC: 6.5 10*3/uL (ref 4.0–10.5)
nRBC: 0 % (ref 0.0–0.2)

## 2020-09-19 LAB — SARS CORONAVIRUS 2 BY RT PCR (HOSPITAL ORDER, PERFORMED IN ~~LOC~~ HOSPITAL LAB): SARS Coronavirus 2: NEGATIVE

## 2020-09-19 LAB — RPR: RPR Ser Ql: NONREACTIVE

## 2020-09-19 MED ORDER — LACTATED RINGERS IV SOLN: 500.0000 mL | INTRAVENOUS | Status: DC | PRN

## 2020-09-19 MED ORDER — DIPHENHYDRAMINE HCL 50 MG/ML IJ SOLN
12.5000 mg | INTRAMUSCULAR | Status: DC | PRN
  Administered 2020-09-20 (×2): 12.5 mg via INTRAVENOUS
  Filled 2020-09-19: qty 1

## 2020-09-19 MED ORDER — LACTATED RINGERS IV SOLN: INTRAVENOUS | Status: DC

## 2020-09-19 MED ORDER — OXYTOCIN-SODIUM CHLORIDE 30-0.9 UT/500ML-% IV SOLN: 1.0000 m[IU]/min | INTRAVENOUS | Status: DC

## 2020-09-19 MED ORDER — TERBUTALINE SULFATE 1 MG/ML IJ SOLN
0.2500 mg | Freq: Once | INTRAMUSCULAR | Status: AC | PRN
  Administered 2020-09-20: 0.25 mg via SUBCUTANEOUS
  Filled 2020-09-19: qty 1

## 2020-09-19 MED ORDER — EPHEDRINE 5 MG/ML INJ: 10.0000 mg | INTRAVENOUS | Status: DC | PRN

## 2020-09-19 MED ORDER — LIDOCAINE HCL (PF) 1 % IJ SOLN
INTRAMUSCULAR | Status: DC | PRN
  Administered 2020-09-19: 3 mL via EPIDURAL
  Administered 2020-09-19: 4 mL via EPIDURAL
  Administered 2020-09-20 (×2): 2.5 mL via EPIDURAL

## 2020-09-19 MED ORDER — OXYTOCIN-SODIUM CHLORIDE 30-0.9 UT/500ML-% IV SOLN
1.0000 m[IU]/min | INTRAVENOUS | Status: DC
  Administered 2020-09-19: 2 m[IU]/min via INTRAVENOUS
  Filled 2020-09-19: qty 500

## 2020-09-19 MED ORDER — SOD CITRATE-CITRIC ACID 500-334 MG/5ML PO SOLN: 30.0000 mL | ORAL | Status: DC | PRN

## 2020-09-19 MED ORDER — LIDOCAINE HCL (PF) 1 % IJ SOLN: 30.0000 mL | INTRAMUSCULAR | Status: DC | PRN

## 2020-09-19 MED ORDER — INFLUENZA VAC SPLIT QUAD 0.5 ML IM SUSY
0.5000 mL | PREFILLED_SYRINGE | INTRAMUSCULAR | Status: DC | PRN
  Filled 2020-09-19: qty 0.5

## 2020-09-19 MED ORDER — FENTANYL-BUPIVACAINE-NACL 0.5-0.125-0.9 MG/250ML-% EP SOLN
12.0000 mL/h | EPIDURAL | Status: DC | PRN
Start: 2020-09-19 — End: 2020-09-20
  Administered 2020-09-19: 10 mL/h via EPIDURAL
  Filled 2020-09-19: qty 250

## 2020-09-19 MED ORDER — ONDANSETRON HCL 4 MG/2ML IJ SOLN
4.0000 mg | Freq: Four times a day (QID) | INTRAMUSCULAR | Status: DC | PRN
  Administered 2020-09-20: 4 mg via INTRAVENOUS
  Filled 2020-09-19: qty 2

## 2020-09-19 MED ORDER — LACTATED RINGERS IV SOLN
500.0000 mL | Freq: Once | INTRAVENOUS | Status: AC
  Administered 2020-09-19: 500 mL via INTRAVENOUS

## 2020-09-19 MED ORDER — PHENYLEPHRINE 40 MCG/ML (10ML) SYRINGE FOR IV PUSH (FOR BLOOD PRESSURE SUPPORT): 80.0000 ug | PREFILLED_SYRINGE | INTRAVENOUS | Status: DC | PRN

## 2020-09-19 MED ORDER — ACETAMINOPHEN 325 MG PO TABS: 650.0000 mg | ORAL_TABLET | ORAL | Status: DC | PRN

## 2020-09-19 MED ORDER — OXYTOCIN-SODIUM CHLORIDE 30-0.9 UT/500ML-% IV SOLN
2.5000 [IU]/h | INTRAVENOUS | Status: DC
  Filled 2020-09-19: qty 500

## 2020-09-19 MED ORDER — OXYCODONE-ACETAMINOPHEN 5-325 MG PO TABS: 1.0000 | ORAL_TABLET | ORAL | Status: DC | PRN

## 2020-09-19 MED ORDER — FENTANYL CITRATE (PF) 100 MCG/2ML IJ SOLN
100.0000 ug | INTRAMUSCULAR | Status: DC | PRN
  Administered 2020-09-19 (×2): 100 ug via INTRAVENOUS
  Filled 2020-09-19 (×2): qty 2

## 2020-09-19 MED ORDER — FLEET ENEMA 7-19 GM/118ML RE ENEM: 1.0000 | ENEMA | Freq: Every day | RECTAL | Status: DC | PRN

## 2020-09-19 MED ORDER — OXYCODONE-ACETAMINOPHEN 5-325 MG PO TABS: 2.0000 | ORAL_TABLET | ORAL | Status: DC | PRN

## 2020-09-19 MED ORDER — OXYTOCIN BOLUS FROM INFUSION
333.0000 mL | Freq: Once | INTRAVENOUS | Status: AC
  Administered 2020-09-20: 333 mL via INTRAVENOUS

## 2020-09-19 MED ORDER — PHENYLEPHRINE 40 MCG/ML (10ML) SYRINGE FOR IV PUSH (FOR BLOOD PRESSURE SUPPORT)
80.0000 ug | PREFILLED_SYRINGE | INTRAVENOUS | Status: DC | PRN
  Administered 2020-09-20 (×2): 80 ug via INTRAVENOUS
  Filled 2020-09-19: qty 10

## 2020-09-19 NOTE — H&P (Addendum)
OBSTETRIC ADMISSION HISTORY AND PHYSICAL  Jacqueline Booker is a 25 y.o. female G2P0010 with IUP at [redacted]w[redacted]d by 16 week ultrasound presenting for elective IOL. She reports +FMs, No LOF, no VB, no blurry vision, headaches or peripheral edema, and RUQ pain.  She plans on breast feeding. She requests POPs for birth control.  She received her prenatal care at [facility].  Dating: By 16 week ultrasound --->  Estimated Date of Delivery: 09/18/20  Sono:  @[redacted]w[redacted]d , CWD, normal anatomy, breech presentation, 766g, 30% EFW   Prenatal History/Complications:  -none  Past Medical History: History reviewed. No pertinent past medical history.  Past Surgical History: History reviewed. No pertinent surgical history.  Obstetrical History: OB History    Gravida  2   Para      Term      Preterm      AB  1   Living  0     SAB  1   IAB      Ectopic      Multiple      Live Births              Social History Social History   Socioeconomic History  . Marital status: Single    Spouse name: Not on file  . Number of children: Not on file  . Years of education: Not on file  . Highest education level: Not on file  Occupational History  . Not on file  Tobacco Use  . Smoking status: Never Smoker  . Smokeless tobacco: Never Used  Vaping Use  . Vaping Use: Never used  Substance and Sexual Activity  . Alcohol use: Never  . Drug use: Never  . Sexual activity: Yes    Birth control/protection: None  Other Topics Concern  . Not on file  Social History Narrative  . Not on file   Social Determinants of Health   Financial Resource Strain: Not on file  Food Insecurity: Not on file  Transportation Needs: Not on file  Physical Activity: Not on file  Stress: Not on file  Social Connections: Not on file    Family History: Family History  Problem Relation Age of Onset  . High Cholesterol Mother   . High Cholesterol Father     Allergies: No Known Allergies  Medications Prior to  Admission  Medication Sig Dispense Refill Last Dose  . Prenatal Vit-Fe Fumarate-FA (PRENATAL VITAMINS PO) Take by mouth.        Review of Systems   All systems reviewed and negative except as stated in HPI  Height 4\' 11"  (1.499 m), weight 69.4 kg, last menstrual period 12/26/2019. General appearance: alert, cooperative and appears stated age Lungs: normal WOB Heart: regular rate Abdomen: soft, non-tender Extremities: no sign of DVT Presentation: cephalic Fetal monitoringBaseline: 120 bpm, Variability: Good {> 6 bpm), Accelerations: Reactive and Decelerations: Absent Uterine activityNone Dilation: 3 Effacement (%): 50 Station: -2 Exam by:: lee   Prenatal labs: ABO, Rh: --/--/A POS (01/25 [id number]) Antibody: NEG (01/25 0749) Rubella: 3.92 (08/12 1422) RPR: Non Reactive (11/05 1003)  HBsAg: Negative (08/12 1422)  HIV: Non Reactive (11/05 1003)  GBS: Negative/-- (12/23 1538)  2 hr Glucola wnl Genetic screening wnl Anatomy 05-19-1998 wnl  Prenatal Transfer Tool  Maternal Diabetes: No Genetic Screening: Normal Maternal Ultrasounds/Referrals: Normal Fetal Ultrasounds or other Referrals:  None Maternal Substance Abuse:  No Significant Maternal Medications:  None Significant Maternal Lab Results: Group B Strep negative  Results for orders placed or performed during the  hospital encounter of 09/19/20 (from the past 24 hour(s))  CBC   Collection Time: 09/19/20  7:49 AM  Result Value Ref Range   WBC 6.5 4.0 - 10.5 K/uL   RBC 4.21 3.87 - 5.11 MIL/uL   Hemoglobin 10.0 (L) 12.0 - 15.0 g/dL   HCT 44.0 (L) 10.2 - 72.5 %   MCV 79.1 (L) 80.0 - 100.0 fL   MCH 23.8 (L) 26.0 - 34.0 pg   MCHC 30.0 30.0 - 36.0 g/dL   RDW 36.6 44.0 - 34.7 %   Platelets 169 150 - 400 K/uL   nRBC 0.0 0.0 - 0.2 %  Type and screen   Collection Time: 09/19/20  7:49 AM  Result Value Ref Range   ABO/RH(D) A POS    Antibody Screen NEG    Sample Expiration      09/22/2020,2359 Performed at [facility]  [facility], [address] [place], [place] [address]     Patient Active Problem List   Diagnosis Date Noted  . Term pregnancy 09/19/2020  . Supervision of normal first pregnancy, antepartum 04/06/2020    Assessment/Plan:  Jacqueline Booker is a 25 y.o. G2P0010 at [redacted]w[redacted]d here for elective IOL.  #Labor: Given favorable cervix, start Pit 2x2. #Pain: Discussed options for pain management during labor---patient undecided at present. #FWB: Cat I  #ID: GBS negative #MOF: breast #MOC: patch #Circ: declines  De Hollingshead, DO  09/19/2020, 9:03 AM

## 2020-09-19 NOTE — Progress Notes (Signed)
LABOR PROGRESS NOTE  Jacqueline Booker is a 25 y.o. G2P0010 at [redacted]w[redacted]d  admitted for elective IOL.   Subjective: Strip note   Objective: BP 120/70   Pulse 66   Temp 98 F (36.7 C) (Oral)   Resp 16   Ht 4\' 11"  (1.499 m)   Wt 69.4 kg   LMP 12/26/2019 (Within Days)   BMI 30.90 kg/m  or  Vitals:   09/19/20 1507 09/19/20 1559 09/19/20 1648 09/19/20 1749  BP: 125/84 127/71 125/73 120/70  Pulse: 77 69 70 66  Resp:  16 16   Temp:      TempSrc:      Weight:      Height:        Dilation: 3 Effacement (%): 70 Cervical Position: Posterior Station: -2 Presentation: Vertex Exam by:: lee FHT: baseline rate 125, moderate varibility, +acel, no decel Toco: q65min, palpates mild   Labs: Lab Results  Component Value Date   WBC 6.5 09/19/2020   HGB 10.0 (L) 09/19/2020   HCT 33.3 (L) 09/19/2020   MCV 79.1 (L) 09/19/2020   PLT 169 09/19/2020    Patient Active Problem List   Diagnosis Date Noted  . Term pregnancy 09/19/2020  . Supervision of normal first pregnancy, antepartum 04/06/2020    Assessment / Plan: 25 y.o. G2P0010 at [redacted]w[redacted]d here for elective IOL.   Labor: Not yet in active labor. On Pitocin at 20 mu/min. Continue to titrate as appropriate.  Fetal Wellbeing:  Cat I  Pain Control:  Plans for epidural  Anticipated MOD:  NSVD  [redacted]w[redacted]d, D.O. OB Fellow  09/19/2020, 6:16 PM

## 2020-09-19 NOTE — Anesthesia Preprocedure Evaluation (Signed)
Anesthesia Evaluation  Patient identified by MRN, date of birth, ID band Patient awake    Reviewed: Allergy & Precautions, Patient's Chart, lab work & pertinent test results  Airway Mallampati: II  TM Distance: >3 FB Neck ROM: Full    Dental no notable dental hx. (+) Teeth Intact   Pulmonary neg pulmonary ROS,    Pulmonary exam normal breath sounds clear to auscultation       Cardiovascular Normal cardiovascular exam Rhythm:Regular Rate:Normal     Neuro/Psych negative neurological ROS  negative psych ROS   GI/Hepatic Neg liver ROS, GERD  ,  Endo/Other  Obesity  Renal/GU negative Renal ROS  negative genitourinary   Musculoskeletal negative musculoskeletal ROS (+)   Abdominal (+) + obese,   Peds  Hematology  (+) anemia ,   Anesthesia Other Findings   Reproductive/Obstetrics (+) Pregnancy                             Anesthesia Physical Anesthesia Plan  ASA: II  Anesthesia Plan: Epidural   Post-op Pain Management:    Induction:   PONV Risk Score and Plan:   Airway Management Planned: Natural Airway  Additional Equipment:   Intra-op Plan:   Post-operative Plan:   Informed Consent: I have reviewed the patients History and Physical, chart, labs and discussed the procedure including the risks, benefits and alternatives for the proposed anesthesia with the patient or authorized representative who has indicated his/her understanding and acceptance.       Plan Discussed with: Anesthesiologist  Anesthesia Plan Comments:         Anesthesia Quick Evaluation

## 2020-09-19 NOTE — Progress Notes (Signed)
LABOR PROGRESS NOTE  Jacqueline Booker is a 25 y.o. G2P0010 at [redacted]w[redacted]d admitted for elective IOL.   Subjective: Pt resting comfortably with epidural in place.  Objective: BP 115/70   Pulse 62   Temp 97.7 F (36.5 C) (Oral)   Resp 18   Ht 4\' 11"  (1.499 m)   Wt 69.4 kg   LMP 12/26/2019 (Within Days)   BMI 30.90 kg/m  or  Vitals:   09/19/20 2130 09/19/20 2230 09/19/20 2300 09/19/20 2330  BP: 118/70 123/71 113/69 115/70  Pulse: 62 66 62 62  Resp:      Temp:      TempSrc:      Weight:      Height:        Dilation: 5 Effacement (%): 90 Cervical Position: Posterior Station: 0 Presentation: Vertex Exam by:: Dr. [email or web address] FHT: baseline rate 125, moderate varibility, +accel, intermittent variable decels Toco: q2-81min, 140 MVU  Labs: Lab Results  Component Value Date   WBC 6.5 09/19/2020   HGB 10.0 (L) 09/19/2020   HCT 33.3 (L) 09/19/2020   MCV 79.1 (L) 09/19/2020   PLT 169 09/19/2020    Patient Active Problem List   Diagnosis Date Noted  . Term pregnancy 09/19/2020  . Supervision of normal first pregnancy, antepartum 04/06/2020    Assessment / Plan: 25 y.o. G2P0010 at [redacted]w[redacted]d here for elective IOL.   Labor: Pt started on pitocin at 0930. She then had AROM for clear, blood-tinged fluid at 2200. Pt with subsequent prolonged decel at 2220 which resolved with discontinuation of pitocin, maternal repositioning and IVF bolus. FSE and IUPC were placed. Able to restart pitocin at 2230; will continue to up-titrate pitocin to achieve adequacy with contractions pending fetal tolerance. Fetal Wellbeing:  Cat 2 strip given intermittent variable decels; reassuringly moderate variability throughout with multiple +accels. Will consider amnioinfusion if variable decels become recurrent. Pain Control:  Epidural in place. Anticipated MOD:  NSVD  2231, MD OB Fellow, Faculty Practice 09/19/2020 11:53 PM

## 2020-09-19 NOTE — Anesthesia Procedure Notes (Signed)
Epidural Patient location during procedure: OB Start time: 09/19/2020 8:27 PM End time: 09/19/2020 8:34 PM  Staffing Anesthesiologist: Mal Amabile, MD Performed: anesthesiologist   Preanesthetic Checklist Completed: patient identified, IV checked, site marked, risks and benefits discussed, surgical consent, monitors and equipment checked, pre-op evaluation and timeout performed  Epidural Patient position: sitting Prep: DuraPrep and site prepped and draped Patient monitoring: continuous pulse ox and blood pressure Approach: midline Location: L4-L5 Injection technique: LOR air  Needle:  Needle type: Tuohy  Needle gauge: 17 G Needle length: 9 cm and 9 Needle insertion depth: 6 cm Catheter type: closed end flexible Catheter size: 19 Gauge Catheter at skin depth: 10 cm Test dose: negative and Other  Assessment Events: blood not aspirated, injection not painful, no injection resistance, no paresthesia and negative IV test  Additional Notes Patient identified. Risks and benefits discussed including failed block, incomplete  Pain control, post dural puncture headache, nerve damage, paralysis, blood pressure Changes, nausea, vomiting, reactions to medications-both toxic and allergic and post Partum back pain. All questions were answered. Patient expressed understanding and wished to proceed. Sterile technique was used throughout procedure. Epidural site was Dressed with sterile barrier dressing. No paresthesias, signs of intravascular injection Or signs of intrathecal spread were encountered.  Patient was more comfortable after the epidural was dosed. Please see RN's note for documentation of vital signs and FHR which are stable. Reason for block:procedure for pain

## 2020-09-20 ENCOUNTER — Encounter (HOSPITAL_COMMUNITY): Payer: Self-pay | Admitting: Obstetrics & Gynecology

## 2020-09-20 ENCOUNTER — Encounter (HOSPITAL_COMMUNITY): Payer: 59

## 2020-09-20 DIAGNOSIS — O480 Post-term pregnancy: Secondary | ICD-10-CM

## 2020-09-20 DIAGNOSIS — R87610 Atypical squamous cells of undetermined significance on cytologic smear of cervix (ASC-US): Secondary | ICD-10-CM | POA: Insufficient documentation

## 2020-09-20 DIAGNOSIS — Z3A40 40 weeks gestation of pregnancy: Secondary | ICD-10-CM

## 2020-09-20 LAB — CBC
HCT: 21.4 % — ABNORMAL LOW (ref 36.0–46.0)
Hemoglobin: 6.8 g/dL — CL (ref 12.0–15.0)
MCH: 24.7 pg — ABNORMAL LOW (ref 26.0–34.0)
MCHC: 31.8 g/dL (ref 30.0–36.0)
MCV: 77.8 fL — ABNORMAL LOW (ref 80.0–100.0)
Platelets: 154 10*3/uL (ref 150–400)
RBC: 2.75 MIL/uL — ABNORMAL LOW (ref 3.87–5.11)
RDW: 15.8 % — ABNORMAL HIGH (ref 11.5–15.5)
WBC: 18 10*3/uL — ABNORMAL HIGH (ref 4.0–10.5)
nRBC: 0 % (ref 0.0–0.2)

## 2020-09-20 LAB — COMPREHENSIVE METABOLIC PANEL
ALT: 16 U/L (ref 0–44)
AST: 29 U/L (ref 15–41)
Albumin: 2.3 g/dL — ABNORMAL LOW (ref 3.5–5.0)
Alkaline Phosphatase: 150 U/L — ABNORMAL HIGH (ref 38–126)
Anion gap: 11 (ref 5–15)
BUN: 11 mg/dL (ref 6–20)
CO2: 19 mmol/L — ABNORMAL LOW (ref 22–32)
Calcium: 8.3 mg/dL — ABNORMAL LOW (ref 8.9–10.3)
Chloride: 102 mmol/L (ref 98–111)
Creatinine, Ser: 1.22 mg/dL — ABNORMAL HIGH (ref 0.44–1.00)
GFR, Estimated: >60 mL/min (ref >60)
Glucose, Bld: 94 mg/dL (ref 70–99)
Potassium: 4.5 mmol/L (ref 3.5–5.1)
Sodium: 132 mmol/L — ABNORMAL LOW (ref 135–145)
Total Bilirubin: 0.7 mg/dL (ref 0.3–1.2)
Total Protein: 6 g/dL — ABNORMAL LOW (ref 6.5–8.1)

## 2020-09-20 LAB — PREPARE RBC (CROSSMATCH)

## 2020-09-20 LAB — ABO/RH: ABO/RH(D): A POS

## 2020-09-20 MED ORDER — TRANEXAMIC ACID-NACL 1000-0.7 MG/100ML-% IV SOLN
INTRAVENOUS | Status: AC
  Filled 2020-09-20: qty 100

## 2020-09-20 MED ORDER — BENZOCAINE-MENTHOL 20-0.5 % EX AERO
1.0000 "application " | INHALATION_SPRAY | CUTANEOUS | Status: DC | PRN
  Administered 2020-09-20: 1 via TOPICAL
  Filled 2020-09-20: qty 56

## 2020-09-20 MED ORDER — TETANUS-DIPHTH-ACELL PERTUSSIS 5-2.5-18.5 LF-MCG/0.5 IM SUSY: 0.5000 mL | PREFILLED_SYRINGE | Freq: Once | INTRAMUSCULAR | Status: DC

## 2020-09-20 MED ORDER — ONDANSETRON HCL 4 MG PO TABS: 4.0000 mg | ORAL_TABLET | ORAL | Status: DC | PRN

## 2020-09-20 MED ORDER — TRANEXAMIC ACID-NACL 1000-0.7 MG/100ML-% IV SOLN: 1000.0000 mg | Freq: Once | INTRAVENOUS | Status: DC

## 2020-09-20 MED ORDER — COCONUT OIL OIL
1.0000 "application " | TOPICAL_OIL | Status: DC | PRN
  Administered 2020-09-20: 1 via TOPICAL

## 2020-09-20 MED ORDER — WITCH HAZEL-GLYCERIN EX PADS
1.0000 "application " | MEDICATED_PAD | CUTANEOUS | Status: DC | PRN
  Administered 2020-09-21: 1 via TOPICAL

## 2020-09-20 MED ORDER — DIBUCAINE (PERIANAL) 1 % EX OINT
1.0000 "application " | TOPICAL_OINTMENT | CUTANEOUS | Status: DC | PRN
  Administered 2020-09-21: 1 via RECTAL
  Filled 2020-09-20: qty 28

## 2020-09-20 MED ORDER — DIPHENHYDRAMINE HCL 25 MG PO CAPS: 25.0000 mg | ORAL_CAPSULE | Freq: Four times a day (QID) | ORAL | Status: DC | PRN

## 2020-09-20 MED ORDER — SENNOSIDES-DOCUSATE SODIUM 8.6-50 MG PO TABS
2.0000 | ORAL_TABLET | Freq: Every day | ORAL | Status: DC
  Administered 2020-09-21 – 2020-09-22 (×2): 2 via ORAL
  Filled 2020-09-20 (×2): qty 2

## 2020-09-20 MED ORDER — BUPIVACAINE HCL (PF) 0.25 % IJ SOLN: INTRAMUSCULAR | Status: DC | PRN

## 2020-09-20 MED ORDER — LACTATED RINGERS AMNIOINFUSION
INTRAVENOUS | Status: DC
  Filled 2020-09-20 (×3): qty 1000

## 2020-09-20 MED ORDER — IBUPROFEN 600 MG PO TABS
600.0000 mg | ORAL_TABLET | Freq: Four times a day (QID) | ORAL | Status: DC
  Administered 2020-09-20 – 2020-09-22 (×8): 600 mg via ORAL
  Filled 2020-09-20 (×8): qty 1

## 2020-09-20 MED ORDER — SIMETHICONE 80 MG PO CHEW: 80.0000 mg | CHEWABLE_TABLET | ORAL | Status: DC | PRN

## 2020-09-20 MED ORDER — SODIUM CHLORIDE 0.9% IV SOLUTION: Freq: Once | INTRAVENOUS | Status: AC

## 2020-09-20 MED ORDER — ONDANSETRON HCL 4 MG/2ML IJ SOLN: 4.0000 mg | INTRAMUSCULAR | Status: DC | PRN

## 2020-09-20 MED ORDER — PRENATAL MULTIVITAMIN CH
1.0000 | ORAL_TABLET | Freq: Every day | ORAL | Status: DC
  Administered 2020-09-21 – 2020-09-22 (×2): 1 via ORAL
  Filled 2020-09-20 (×2): qty 1

## 2020-09-20 MED ORDER — ACETAMINOPHEN 325 MG PO TABS
650.0000 mg | ORAL_TABLET | ORAL | Status: DC | PRN
  Administered 2020-09-22: 650 mg via ORAL
  Filled 2020-09-20: qty 2

## 2020-09-20 MED ORDER — BUPIVACAINE HCL (PF) 0.25 % IJ SOLN
INTRAMUSCULAR | Status: DC | PRN
  Administered 2020-09-20 (×2): 2.5 mL via EPIDURAL

## 2020-09-20 NOTE — Progress Notes (Signed)
LABOR PROGRESS NOTE  Jacqueline Booker is a 25 y.o. G2P0010 at [redacted]w[redacted]d admitted for elective IOL.   Subjective: Doing well without complaints.  Objective: BP 120/76   Pulse 99   Temp 98.2 F (36.8 C) (Oral)   Resp 18   Ht 4\' 11"  (1.499 m)   Wt 69.4 kg   LMP 12/26/2019 (Within Days)   BMI 30.90 kg/m  or  Vitals:   09/20/20 0931 09/20/20 1001 09/20/20 1031 09/20/20 1100  BP: 116/75 119/75 115/73 120/76  Pulse: 88 84 85 99  Resp: 20 18 18    Temp:      TempSrc:      Weight:      Height:        Dilation: 9 Effacement (%): 90 Cervical Position: Posterior Station: 0 Presentation: Vertex Exam by:: , RN FHT: baseline rate 150, moderate varibility, +accels, recently intermittent late decels with contractions Toco: q1-4min, 200 MVU  Labs: Lab Results  Component Value Date   WBC 6.5 09/19/2020   HGB 10.0 (L) 09/19/2020   HCT 33.3 (L) 09/19/2020   MCV 79.1 (L) 09/19/2020   PLT 169 09/19/2020    Patient Active Problem List   Diagnosis Date Noted  . ASCUS of cervix with negative high risk HPV 09/20/2020  . Term pregnancy 09/19/2020  . Supervision of normal first pregnancy, antepartum 04/06/2020    Assessment / Plan: 25 y.o. G2P0010 at [redacted]w[redacted]d here for elective IOL.   IOL: Pt started on pitocin at 0930 on 1/25. She then had AROM for clear, blood-tinged fluid at 2200. Pt with subsequent prolonged decel at 2220 which resolved with discontinuation of pitocin, maternal repositioning and IVF bolus. FSE and IUPC were placed. Able to restart pitocin at 2230. Most recently, prolonged decel at 0348 with need to discontinue pitocin. Able to restart pitocin at 0445. Patient has made good cervical change and has adequate contractions, continue. Fetal Wellbeing:  Category 2, previously resolved with IVF and repositioning. Suspect in the setting of rapid cervical change this morning. Will continue to monitor. Pain Control:  Epidural in place.  2221, MD OB Fellow,  Faculty Practice 09/20/2020 11:24 AM

## 2020-09-20 NOTE — Progress Notes (Signed)
Jacqueline Booker was able to share some of how she was feeling watching her baby and not being able to do anything for him.  She is relieved that he is breathing better and is no longer needing respiratory support.  She is concerned about her blood pressure dropping so low and is not feeling too well.  I  will follow up with family tomorrow, but please page as needs arise.  Chaplain Dyanne Carrel, Bcc Pager, [phone] 4:51 PM

## 2020-09-20 NOTE — Progress Notes (Addendum)
LABOR PROGRESS NOTE  Amberlie Gaillard is a 25 y.o. G2P0010 at [redacted]w[redacted]d admitted for elective IOL.   Subjective: Doing well without complaints.  Objective: BP 119/75   Pulse 84   Temp 98.2 F (36.8 C) (Oral)   Resp 18   Ht 4\' 11"  (1.499 m)   Wt 69.4 kg   LMP 12/26/2019 (Within Days)   BMI 30.90 kg/m  or  Vitals:   09/20/20 0831 09/20/20 0901 09/20/20 0931 09/20/20 1001  BP: 119/66 119/63 116/75 119/75  Pulse: 94 (!) 103 88 84  Resp: 20 20 20 18   Temp:      TempSrc:      Weight:      Height:        Dilation: 8 Effacement (%): 90 Cervical Position: Posterior Station: 0 Presentation: Vertex Exam by:: Dr. : baseline rate 140, moderate varibility, +accels, recently intermittent late decels with contractions Toco: q1-57min, 200 MVU  Labs: Lab Results  Component Value Date   WBC 6.5 09/19/2020   HGB 10.0 (L) 09/19/2020   HCT 33.3 (L) 09/19/2020   MCV 79.1 (L) 09/19/2020   PLT 169 09/19/2020    Patient Active Problem List   Diagnosis Date Noted  . ASCUS of cervix with negative high risk HPV 09/20/2020  . Term pregnancy 09/19/2020  . Supervision of normal first pregnancy, antepartum 04/06/2020    Assessment / Plan: 25 y.o. G2P0010 at [redacted]w[redacted]d here for elective IOL.   IOL: Pt started on pitocin at 0930 on 1/25. She then had AROM for clear, blood-tinged fluid at 2200. Pt with subsequent prolonged decel at 2220 which resolved with discontinuation of pitocin, maternal repositioning and IVF bolus. FSE and IUPC were placed. Able to restart pitocin at 2230. Most recently, prolonged decel at 0348 with need to discontinue pitocin. Able to restart pitocin at 0445. Patient has made good cervical change and has adequate contractions, continue. Fetal Wellbeing:  Category 2, will start IVF bolus and initiate position changes, good scalp stim, overall reassuring Pain Control:  Epidural in place.  2221, MD OB Fellow, Faculty Practice 09/20/2020 10:16 AM

## 2020-09-20 NOTE — Discharge Summary (Addendum)
Postpartum Discharge Summary     Patient Name: Jacqueline Booker DOB: 03-Aug-1996 MRN: [id number]  Date of admission: 09/19/2020 Delivery date:09/20/2020  Delivering provider: Arrie Senate  Date of discharge: 09/22/2020  Admitting diagnosis: Pregnancy at 40/1. Elective IOL   Discharge diagnosis: Term Pregnancy Delivered. Status post PPH at delivery                                              Post partum procedures:blood transfusion Augmentation: AROM and Pitocin Complications: PPH EBL 709KH due to lacerations at delivery that responded to [facility] course: Induction of Labor With Vaginal Delivery   25 y.o. yo G2P1011 at 52w2dwas admitted to the hospital 09/19/2020 for induction of labor.  Indication for induction: Elective.  Patient was started on pitocin at admission and AROM was later performed. Patient then had a prolonged deceleration and pitocin was stopped and IUPC/FSE was placed at that time, with recovery. Pitocin was then restarted and patient eventually had another prolonged deceleration and pitocin was paused and ultimately restarted. Patient progressed to complete and had an uncomplicated vaginal delivery, baby to NICU at delivery. Membrane Rupture Time/Date: 9:55 PM ,09/19/2020   Delivery Method:Vaginal, Spontaneous  Episiotomy: None  Lacerations:  2nd degree;Sulcus;Labial  Details of delivery can be found in separate delivery note.  Patient had symptomatic anemia and received 1U PRBC with still some symptomatic anemia and then she received 2 more units PRBC on 1/27. Her CBC on 1/28 was 8.5. She was also feeling asymptomatic after her transfusion on the day of discharge.   Newborn Data: Birth date:09/20/2020  Birth time:2:39 PM  Gender:Female  Living status:Living  Apgars:3 ,4  Weight:2925 g   Magnesium Sulfate received: No BMZ received: No Rhophylac:N/A MMR:N/A T-DaP:Given prenatally Flu: No  Physical exam  Vitals:   09/21/20 1457 09/21/20 1922 09/21/20  2328 09/22/20 0921  BP: (!) 95/46 128/61 123/62 (!) 114/56  Pulse: 85 93 80 68  Resp: '17 16 17 17  ' Temp: 97.8 F (36.6 C) 98.1 F (36.7 C) 97.7 F (36.5 C) 98.4 F (36.9 C)  TempSrc: Oral Oral Oral Oral  SpO2: 100% 100% 100% 98%  Weight:      Height:       General: alert Lochia: appropriate Uterine Fundus: nttp, firm Incision: deferred  Labs: CBC Latest Ref Rng & Units 09/22/2020 09/21/2020 09/20/2020  WBC 4.0 - 10.5 K/uL 14.6(H) 16.2(H) 18.0(H)  Hemoglobin 12.0 - 15.0 g/dL 8.5(L) 6.8(LL) 6.8(LL)  Hematocrit 36.0 - 46.0 % 26.1(L) 21.0(L) 21.4(L)  Platelets 150 - 400 K/uL 130(L) 118(L) 154    CMP Latest Ref Rng & Units 09/21/2020  Glucose 70 - 99 mg/dL 72  BUN 6 - 20 mg/dL 15  Creatinine 0.44 - 1.00 mg/dL 0.97  Sodium 135 - 145 mmol/L 137  Potassium 3.5 - 5.1 mmol/L 4.3  Chloride 98 - 111 mmol/L 109  CO2 22 - 32 mmol/L 21(L)  Calcium 8.9 - 10.3 mg/dL 7.8(L)  Total Protein 6.5 - 8.1 g/dL 4.1(L)  Total Bilirubin 0.3 - 1.2 mg/dL 0.3  Alkaline Phos 38 - 126 U/L 85  AST 15 - 41 U/L 23  ALT 0 - 44 U/L 14   Edinburgh Score: Edinburgh Postnatal Depression Scale Screening Tool 09/22/2020  I have been able to laugh and see the funny side of things. 0  I have looked forward  with enjoyment to things. 0  I have blamed myself unnecessarily when things went wrong. 1  I have been anxious or worried for no good reason. 2  I have felt scared or panicky for no good reason. 1  Things have been getting on top of me. 1  I have been so unhappy that I have had difficulty sleeping. 0  I have felt sad or miserable. 1  I have been so unhappy that I have been crying. 1  The thought of harming myself has occurred to me. 0  Edinburgh Postnatal Depression Scale Total 7     After visit meds:  Allergies as of 09/22/2020   No Known Allergies     Medication List    TAKE these medications   acetaminophen 325 MG tablet Commonly known as: Tylenol Take 2 tablets (650 mg total) by mouth every 4  (four) hours as needed (for pain scale < 4).   docusate sodium 100 MG capsule Commonly known as: COLACE Take 1 capsule (100 mg total) by mouth 2 (two) times daily as needed.   ferrous sulfate 325 (65 FE) MG tablet Commonly known as: FerrouSul Take 1 tablet (325 mg total) by mouth every other day.   hydrocortisone 2.5 % rectal cream Commonly known as: ANUSOL-HC Place rectally 2 (two) times daily.   ibuprofen 600 MG tablet Commonly known as: ADVIL Take 1 tablet (600 mg total) by mouth every 6 (six) hours as needed.   oxyCODONE 5 MG immediate release tablet Commonly known as: Oxy IR/ROXICODONE Take 1 tablet (5 mg total) by mouth every 4 (four) hours as needed for severe pain or breakthrough pain.   PRENATAL VITAMINS PO Take 1 tablet by mouth daily.        Discharge home in stable condition Infant Feeding: Breast Infant Disposition:NICU Discharge instruction: per After Visit Summary and Postpartum booklet. Activity: Advance as tolerated. Pelvic rest for 6 weeks.  Diet: routine diet Future Appointments: Future Appointments  Date Time Provider [place]  10/19/2020  2:00 PM Truett Mainland, DO [facility] None   Follow up Visit: Message sent to [facility] 09/20/20 by Sylvester Harder.   Please schedule this patient for a In person postpartum visit in 4 weeks with the following provider: Any provider. Additional Postpartum F/U:none  Low risk pregnancy complicated by: n/a Delivery mode:  Vaginal, Spontaneous  Anticipated Birth Control:  Unsure   09/22/2020 Verita Schneiders, MD

## 2020-09-20 NOTE — Discharge Instructions (Signed)

## 2020-09-20 NOTE — Progress Notes (Addendum)
LABOR PROGRESS NOTE  Jacqueline Rollo is a 25 y.o. G2P0010 at [redacted]w[redacted]d admitted for elective IOL.   Subjective: Pt resting comfortably. Reports some discomfort in hips from left-side lying position. No other concerns at this time.  Objective: BP 118/65   Pulse (!) 104   Temp 98.2 F (36.8 C) (Oral)   Resp 18   Ht 4\' 11"  (1.499 m)   Wt 69.4 kg   LMP 12/26/2019 (Within Days)   BMI 30.90 kg/m  or  Vitals:   09/20/20 0630 09/20/20 0700 09/20/20 0730 09/20/20 0811  BP: 109/62 120/71 121/72 118/65  Pulse: 98 (!) 105 (!) 107 (!) 104  Resp: 18 18 20 18   Temp: 98.2 F (36.8 C)     TempSrc: Oral     Weight:      Height:        Dilation: 6 Effacement (%): 90 Cervical Position: Posterior Station: Plus 1 Presentation: Vertex Exam by:: Dr. FHT: baseline rate 130, moderate varibility, +accels, no decels Toco: q45min, 180 MVU  Labs: Lab Results  Component Value Date   WBC 6.5 09/19/2020   HGB 10.0 (L) 09/19/2020   HCT 33.3 (L) 09/19/2020   MCV 79.1 (L) 09/19/2020   PLT 169 09/19/2020    Patient Active Problem List   Diagnosis Date Noted  . Term pregnancy 09/19/2020  . Supervision of normal first pregnancy, antepartum 04/06/2020    Assessment / Plan: 25 y.o. G2P0010 at [redacted]w[redacted]d here for elective IOL.   Labor: Pt started on pitocin at 0930 on 1/25. She then had AROM for clear, blood-tinged fluid at 2200. Pt with subsequent prolonged decel at 2220 which resolved with discontinuation of pitocin, maternal repositioning and IVF bolus. FSE and IUPC were placed. Able to restart pitocin at 2230. Most recently, prolonged decel at 0348 with need to discontinue pitocin. Able to restart pitocin at 0445 but minimal cervical change since last exam. Will continue to up-titrate pitocin to achieve adequacy with contractions pending fetal tolerance. Fetal Wellbeing:  Category 1 strip. Pain Control:  Epidural in place.  2221, MD OB Fellow, Faculty Practice 09/20/2020 8:35 AM

## 2020-09-21 DIAGNOSIS — D62 Acute posthemorrhagic anemia: Secondary | ICD-10-CM | POA: Diagnosis not present

## 2020-09-21 DIAGNOSIS — Z5189 Encounter for other specified aftercare: Secondary | ICD-10-CM

## 2020-09-21 LAB — COMPREHENSIVE METABOLIC PANEL
ALT: 14 U/L (ref 0–44)
AST: 23 U/L (ref 15–41)
Albumin: 1.5 g/dL — ABNORMAL LOW (ref 3.5–5.0)
Alkaline Phosphatase: 85 U/L (ref 38–126)
Anion gap: 7 (ref 5–15)
BUN: 15 mg/dL (ref 6–20)
CO2: 21 mmol/L — ABNORMAL LOW (ref 22–32)
Calcium: 7.8 mg/dL — ABNORMAL LOW (ref 8.9–10.3)
Chloride: 109 mmol/L (ref 98–111)
Creatinine, Ser: 0.97 mg/dL (ref 0.44–1.00)
GFR, Estimated: >60 mL/min (ref >60)
Glucose, Bld: 72 mg/dL (ref 70–99)
Potassium: 4.3 mmol/L (ref 3.5–5.1)
Sodium: 137 mmol/L (ref 135–145)
Total Bilirubin: 0.3 mg/dL (ref 0.3–1.2)
Total Protein: 4.1 g/dL — ABNORMAL LOW (ref 6.5–8.1)

## 2020-09-21 LAB — CBC
HCT: 21 % — ABNORMAL LOW (ref 36.0–46.0)
Hemoglobin: 6.8 g/dL — CL (ref 12.0–15.0)
MCH: 25.9 pg — ABNORMAL LOW (ref 26.0–34.0)
MCHC: 32.4 g/dL (ref 30.0–36.0)
MCV: 79.8 fL — ABNORMAL LOW (ref 80.0–100.0)
Platelets: 118 K/uL — ABNORMAL LOW (ref 150–400)
RBC: 2.63 MIL/uL — ABNORMAL LOW (ref 3.87–5.11)
RDW: 15.4 % (ref 11.5–15.5)
WBC: 16.2 K/uL — ABNORMAL HIGH (ref 4.0–10.5)
nRBC: 0 % (ref 0.0–0.2)

## 2020-09-21 LAB — PREPARE RBC (CROSSMATCH)

## 2020-09-21 MED ORDER — ACETAMINOPHEN 500 MG PO TABS
1000.0000 mg | ORAL_TABLET | Freq: Once | ORAL | Status: AC
  Administered 2020-09-21: 1000 mg via ORAL
  Filled 2020-09-21: qty 2

## 2020-09-21 MED ORDER — OXYCODONE HCL 5 MG PO TABS
5.0000 mg | ORAL_TABLET | ORAL | Status: DC | PRN
  Administered 2020-09-21 – 2020-09-22 (×3): 5 mg via ORAL
  Filled 2020-09-21 (×3): qty 1

## 2020-09-21 MED ORDER — DIPHENHYDRAMINE HCL 25 MG PO CAPS
25.0000 mg | ORAL_CAPSULE | Freq: Once | ORAL | Status: AC
  Administered 2020-09-21: 25 mg via ORAL
  Filled 2020-09-21: qty 1

## 2020-09-21 MED ORDER — FUROSEMIDE 10 MG/ML IJ SOLN
20.0000 mg | Freq: Once | INTRAMUSCULAR | Status: AC
  Administered 2020-09-21: 20 mg via INTRAVENOUS
  Filled 2020-09-21: qty 2

## 2020-09-21 NOTE — Progress Notes (Incomplete)
First unit of blood stopped at 1203

## 2020-09-21 NOTE — Anesthesia Postprocedure Evaluation (Signed)
Anesthesia Post Note  Patient: [name]  Procedure(s) Performed: AN AD HOC LABOR EPIDURAL     Patient location during evaluation: Mother Baby Anesthesia Type: Epidural Level of consciousness: awake and alert Pain management: pain level controlled Vital Signs Assessment: post-procedure vital signs reviewed and stable Respiratory status: spontaneous breathing, nonlabored ventilation and respiratory function stable Cardiovascular status: stable Postop Assessment: no headache, no backache and epidural receding Anesthetic complications: no   No complications documented.  Last Vitals:  Vitals:   09/21/20 0433 09/21/20 0753  BP: (!) 101/53 (!) 100/55  Pulse: 72 80  Resp: 18 18  Temp: 36.7 C 36.8 C  SpO2: 100% 98%    Last Pain:  Vitals:   09/21/20 0806  TempSrc:   PainSc: 4    Pain Goal: Patients Stated Pain Goal: 3 (09/20/20 2108)                 [name]

## 2020-09-21 NOTE — Progress Notes (Signed)
Patient screened out for psychosocial assessment since none of the following apply: °Psychosocial stressors documented in mother or baby's chart °Gestation less than 32 weeks °Code at delivery  °Infant with anomalies °Please contact the Clinical Social Worker if specific needs arise, by MOB's request, or if MOB scores greater than 9/yes to question 10 on Edinburgh Postpartum Depression Screen. ° °Hasson Gaspard Boyd-Gilyard, MSW, LCSW °Clinical Social Work °[phone] °  °

## 2020-09-21 NOTE — Lactation Note (Signed)
This note was copied from a baby's chart. Lactation Consultation Note  Patient Name: Boy [place] Sarinana [facility]) Today's Date: 09/21/2020 Reason for consult: Initial assessment;NICU baby;Primapara Age:25 hours LC to mother's room for initial consult. Pump set up and mother pumped last night. No pumping yet today. LC cleaned pump pieces and reviewed instructions for use. Mother has been taught hand expression and is aware to use method to collect colostrum for baby today prn. Mother with excessive blood loss/low HGB. Received blood yesterday and will receive another 2 units today. LC reviewed risk of delayed copious milk. Patient was provided with the opportunity to ask questions. All concerns were addressed.  Will plan follow up visit.  Maternal Data Has patient been taught Hand Expression?: Yes Does the patient have breastfeeding experience prior to this delivery?: No Mother with private health insurance and [facility] participation. She will f/u with insurance company about pump to use at home. LC will fax [facility] referral prn.   Lactation Tools Discussed/Used WIC Program: Yes Pump Education: Setup, frequency, and cleaning;Milk Storage Initiated by:: RN Date initiated:: 09/20/20   Consult Status Consult Status: Follow-up Follow-up type: In-patient   Elder Negus, MA IBCLC 09/21/2020, 8:43 AM

## 2020-09-21 NOTE — Plan of Care (Signed)
  Problem: Activity: Goal: Risk for activity intolerance will decrease Outcome: Progressing   Problem: Education: Goal: Knowledge of condition will improve Outcome: Progressing   Problem: Education: Goal: Knowledge of General Education information will improve Description: Including pain rating scale, medication(s)/side effects and non-pharmacologic comfort measures Outcome: Completed/Met   Problem: Nutrition: Goal: Adequate nutrition will be maintained Outcome: Completed/Met   Problem: Coping: Goal: Level of anxiety will decrease Outcome: Completed/Met   Problem: Elimination: Goal: Will not experience complications related to urinary retention Outcome: Completed/Met   Problem: Coping: Goal: Ability to identify and utilize available resources and services will improve Outcome: Completed/Met   Problem: Role Relationship: Goal: Ability to demonstrate positive interaction with newborn will improve Outcome: Completed/Met   Problem: Coping: Goal: Ability to identify and develop effective coping behavior will improve Outcome: Completed/Met

## 2020-09-21 NOTE — Lactation Note (Signed)
This note was copied from a baby's chart. Lactation Consultation Note Attempted to see mom. Mom sleeping soundly as well as FOB. LC noted DEBP set up at bedside.  Left NICU book and Lactation pamphlet at bedside.  Patient Name: Boy Laury Huizar [id number] Date: 09/21/2020   Age:25 hours  Maternal Data    Feeding    LATCH Score                   Interventions    Lactation Tools Discussed/Used     Consult Status      Charyl Dancer 09/21/2020, 2:35 AM

## 2020-09-21 NOTE — Progress Notes (Signed)
Post Partum Day 1 s/p VD after elective IOL complicated by acute blood loss anemia requiring transfusion and neonatal resuscitation. Baby in NICU.  Subjective: Reports mild perineal pain.Up ad lib, voiding and tolerating PO.  Baby is doing better in NICU. Feels very tired.  Objective: Blood pressure (!) 100/55, pulse 80, temperature 98.2 F (36.8 C), temperature source Oral, resp. rate 18, height 4\' 11"  (1.499 m), weight 69.4 kg, last menstrual period 12/26/2019, SpO2 98 %, unknown if currently breastfeeding.  Physical Exam:  General: alert and no distress Lochia: appropriate Uterine Fundus: firm, NT DVT Evaluation: No evidence of DVT seen on physical exam.  Negative Homan's sign. No cords or calf tenderness. No significant calf/ankle edema.  Recent Labs    09/20/20 1549 09/21/20 0649  HGB 6.8* 6.8*  HCT 21.4* 21.0*    Assessment/Plan: Counseled about acute blood loss symptomatic anemia, no current bleeding.  Offered transfusion, she had already received 1 pRBC yesterday. She agreed to further transfusion. 2 pRBCs ordered, will check CBC tomorrow morning. Breastfeeding, has lactation support Desires patch for postpartum contraception Plan for discharge tomorrow if stable  LOS: 2 days   09/23/20, MD 09/21/2020, 8:52 AM

## 2020-09-22 ENCOUNTER — Encounter (HOSPITAL_COMMUNITY): Payer: Self-pay | Admitting: Obstetrics & Gynecology

## 2020-09-22 ENCOUNTER — Ambulatory Visit: Payer: Self-pay

## 2020-09-22 LAB — TYPE AND SCREEN
ABO/RH(D): A POS
Antibody Screen: NEGATIVE
Unit division: 0
Unit division: 0
Unit division: 0

## 2020-09-22 LAB — BPAM RBC
Blood Product Expiration Date: 202202192359
Blood Product Expiration Date: 202202202359
Blood Product Expiration Date: 202202202359
ISSUE DATE / TIME: 202201261808
ISSUE DATE / TIME: 202201270922
ISSUE DATE / TIME: 202201271220
Unit Type and Rh: 6200
Unit Type and Rh: 6200
Unit Type and Rh: 6200

## 2020-09-22 LAB — CBC
HCT: 26.1 % — ABNORMAL LOW (ref 36.0–46.0)
Hemoglobin: 8.5 g/dL — ABNORMAL LOW (ref 12.0–15.0)
MCH: 26.8 pg (ref 26.0–34.0)
MCHC: 32.6 g/dL (ref 30.0–36.0)
MCV: 82.3 fL (ref 80.0–100.0)
Platelets: 130 10*3/uL — ABNORMAL LOW (ref 150–400)
RBC: 3.17 MIL/uL — ABNORMAL LOW (ref 3.87–5.11)
RDW: 16.7 % — ABNORMAL HIGH (ref 11.5–15.5)
WBC: 14.6 10*3/uL — ABNORMAL HIGH (ref 4.0–10.5)
nRBC: 0 % (ref 0.0–0.2)

## 2020-09-22 MED ORDER — OXYCODONE HCL 5 MG PO TABS: 5.0000 mg | ORAL_TABLET | ORAL | 0 refills | Status: DC | PRN

## 2020-09-22 MED ORDER — FERROUS SULFATE 325 (65 FE) MG PO TABS: 325.0000 mg | ORAL_TABLET | ORAL | 0 refills | Status: DC

## 2020-09-22 MED ORDER — IBUPROFEN 600 MG PO TABS: 600.0000 mg | ORAL_TABLET | Freq: Four times a day (QID) | ORAL | 0 refills | Status: DC | PRN

## 2020-09-22 MED ORDER — HYDROCORTISONE (PERIANAL) 2.5 % EX CREA: TOPICAL_CREAM | Freq: Two times a day (BID) | CUTANEOUS | 2 refills | Status: DC

## 2020-09-22 MED ORDER — DOCUSATE SODIUM 100 MG PO CAPS: 100.0000 mg | ORAL_CAPSULE | Freq: Two times a day (BID) | ORAL | 0 refills | Status: DC | PRN

## 2020-09-22 MED ORDER — ACETAMINOPHEN 325 MG PO TABS: 650.0000 mg | ORAL_TABLET | ORAL | Status: DC | PRN

## 2020-09-22 NOTE — Lactation Note (Signed)
This note was copied from a baby's chart. Lactation Consultation Note  Patient Name: Jacqueline Booker [id number] Date: 09/22/2020 Reason for consult: Follow-up assessment;Primapara;1st time breastfeeding;NICU baby;Term Age:25 hours  LC in to visit with P1 Mom of term baby in the NICU.  Baby is being gavage fed and is bottle feeding poorly.  SLP identified an anterior lingual frenulum.  Mom has been pumping every 3 hrs, obtaining a few drops.  Reassured her that this was normal and with consistent pumping, her volume would increase within 6 day.   Encouraged STS with baby, and frequent pumping.   Mom has WIC in [facility], referral sent today.  Mom states WIC told her on discharge, she would be told where to go pick up a pump.  Mom instructed to take all her pump parts home, including the tubing.    Mom provided with a wash and dry bin in the baby's room.  A cooler bag from [facility] provided for transporting her milk to NICU.  Mom plans to return home tonight.     Interventions Interventions: Breast feeding basics reviewed;Skin to skin;Breast massage;Hand express;DEBP  Lactation Tools Discussed/Used Tools: Pump Breast pump type: Double-Electric Breast Pump   Consult Status Consult Status: Follow-up Date: 09/23/20 Follow-up type: In-patient    Jacqueline Booker 09/22/2020, 12:41 PM

## 2020-09-22 NOTE — Lactation Note (Signed)
This note was copied from a baby's chart. Lactation Consultation Note  Patient Name: Jacqueline Booker Depner [id number] Date: 09/22/2020 Reason for consult: Follow-up assessment;Primapara;1st time breastfeeding;NICU baby;Term Age:25 hours   Infant recently had frenectomy.  Offered to assist with latching to breast.  Baby fell back asleep, not due to feed until 3pm.  Mom going home and will pick up [facility] pump.  Mom states she would like assistance tomorrow at 12 noon feeding.    Appt made for 09/23/20 at 12 noon.   Interventions Interventions: Breast feeding basics reviewed;Skin to skin;Breast massage;Hand express;DEBP  Lactation Tools Discussed/Used Tools: Pump Breast pump type: Double-Electric Breast Pump   Consult Status Consult Status: Follow-up Date: 09/23/20 Follow-up type: In-patient    Judee Clara 09/22/2020, 1:44 PM

## 2020-09-22 NOTE — Plan of Care (Signed)
  Problem: Activity: Goal: Risk for activity intolerance will decrease Outcome: Completed/Met   Problem: Education: Goal: Knowledge of condition will improve Outcome: Completed/Met   Problem: Activity: Goal: Will verbalize the importance of balancing activity with adequate rest periods Outcome: Completed/Met   Problem: Life Cycle: Goal: Chance of risk for complications during the postpartum period will decrease Outcome: Completed/Met   Problem: Self-Concept: Goal: Level of anxiety will decrease Outcome: Completed/Met

## 2020-09-22 NOTE — Progress Notes (Signed)
I followed up with Jacqueline Booker and her SO following the difficult delivery of her baby and subsequent NICU admission.  She is very sad that she can't have her baby with her when she discharges and that, due to her continued pain with sitting, she is only able to be in the NICU for limited time.  Her SO  plans to stay with their baby while she rests at home and she plans to visit as much as she is physically able.  I provided education about PMAD and the higher risk that is associated with having a complicated delivery and postpartum period.  I provided ministry of presence and reflective listening.  Chaplain Dyanne Carrel, Bcc Pager, [phone] 11:28 AM

## 2020-09-22 NOTE — Progress Notes (Signed)
Patient discharged to home with husband.  Condition stable.  Patient will ambulate to NICU with plans to leave hospital from there.  No equipment ordered for home at discharge.

## 2020-09-25 ENCOUNTER — Telehealth: Payer: Self-pay

## 2020-09-25 NOTE — Telephone Encounter (Signed)
Called patient to check on her since she had a difficult delivery and her baby was in NICU.  Patient states that he is home now and is doing well. He is feeding well.   Patient states he came home yesterday 09/24/20. Reminded her of her post partum appt on 02/24 but she can also call us if she has a problem before then. Armandina Stammer RN

## 2020-10-19 ENCOUNTER — Ambulatory Visit: Payer: 59 | Admitting: Family Medicine

## 2020-10-23 ENCOUNTER — Encounter: Payer: Self-pay | Admitting: General Practice

## 2020-10-26 ENCOUNTER — Ambulatory Visit (INDEPENDENT_AMBULATORY_CARE_PROVIDER_SITE_OTHER): Payer: Medicaid Other | Admitting: Family Medicine

## 2020-10-26 ENCOUNTER — Other Ambulatory Visit: Payer: Self-pay

## 2020-10-26 ENCOUNTER — Encounter: Payer: Self-pay | Admitting: Family Medicine

## 2020-10-26 MED ORDER — NORELGESTROMIN-ETH ESTRADIOL 150-35 MCG/24HR TD PTWK: 1.0000 | MEDICATED_PATCH | TRANSDERMAL | 3 refills | Status: DC

## 2020-10-26 NOTE — Progress Notes (Signed)
Post Partum Visit Note  Jacqueline Booker is a 25 y.o. G53P1011 female who presents for a postpartum visit. She is 4 weeks postpartum following a normal spontaneous vaginal delivery.  I have fully reviewed the prenatal and intrapartum course. The delivery was at 40 gestational weeks.  Anesthesia: epidural. Postpartum course has been normal. Baby is doing well. Baby is feeding by Enfamil Gentle Ease . Bleeding staining only. Bowel function is abnormal: constiption . Bladder function is normal. Patient is not sexually active. Contraception method is Ship broker weekly. Postpartum depression screening: negative.  Having some pain in vaginal area and noticed a 2cm lump that was tender. Notices the pain more in certain positions or movements.    The pregnancy intention screening data noted above was reviewed. Potential methods of contraception were discussed. The patient elected to proceed with Contraceptive Patch.    Edinburgh Postnatal Depression Scale - 10/26/20 1310      Edinburgh Postnatal Depression Scale:  In the Past 7 Days   I have been able to laugh and see the funny side of things. 0    I have looked forward with enjoyment to things. 0    I have blamed myself unnecessarily when things went wrong. 2    I have been anxious or worried for no good reason. 2    I have felt scared or panicky for no good reason. 0    Things have been getting on top of me. 0    I have been so unhappy that I have had difficulty sleeping. 0    I have felt sad or miserable. 0    I have been so unhappy that I have been crying. 1    The thought of harming myself has occurred to me. 0    Edinburgh Postnatal Depression Scale Total 5          The following portions of the patient's history were reviewed and updated as appropriate: allergies, current medications, past family history, past medical history, past social history, past surgical history and problem list.    Review of Systems Pertinent items are  noted in HPI.    Objective:  BP 114/85   Pulse 87   Wt 132 lb (59.9 kg)   Breastfeeding No   BMI 26.66 kg/m    General:  alert and cooperative  Lungs: clear to auscultation bilaterally  Heart:  regular rate and rhythm, S1, S2 normal, no murmur, click, rub or gallop  Abdomen: soft, non-tender; bowel sounds normal; no masses,  no organomegaly   Vulva:  normal  Vagina: normal vagina, no discharge, exudate, lesion, or erythema        Assessment:    normal postpartum exam. Pap smear not done at today's visit.   Plan:   Essential components of care per ACOG recommendations:  1.  Mood and well being: Patient with negative depression screening today. Reviewed local resources for support.  - Patient does not use tobacco.  - hx of drug use? No    2. Infant care and feeding:  -Patient currently breastmilk feeding? No  -Social determinants of health (SDOH) reviewed in EPIC. No concerns  3. Sexuality, contraception and birth spacing - Patient does not want a pregnancy in the next year.  - Reviewed forms of contraception in tiered fashion. Patient desired Lucienne Minks today.   - Discussed birth spacing of 18 months  4. Sleep and fatigue -Encouraged family/partner/community support of 4 hrs of uninterrupted sleep to help with mood and  fatigue  5. Physical Recovery  - Discussed patients delivery and complications - Patient had a 2nd degree laceration, perineal healing reviewed. Patient expressed understanding - Patient has urinary incontinence? No  - Patient is not safe to resume physical and sexual activity - wait another 2 weeks.  6.  Health Maintenance - Last pap smear done 03/2020 and was ASCUS with negative HPV - rpt PAP in 3 years  Levie Heritage, DO [facility] [employer], [facility]

## 2020-11-13 ENCOUNTER — Other Ambulatory Visit: Payer: Self-pay | Admitting: Obstetrics and Gynecology

## 2020-12-14 ENCOUNTER — Ambulatory Visit (INDEPENDENT_AMBULATORY_CARE_PROVIDER_SITE_OTHER): Payer: Medicaid Other | Admitting: Family Medicine

## 2020-12-14 ENCOUNTER — Encounter: Payer: Self-pay | Admitting: Family Medicine

## 2020-12-14 ENCOUNTER — Other Ambulatory Visit: Payer: Self-pay

## 2020-12-14 VITALS — BP 120/72 | HR 86 | Ht 59.0 in | Wt 131.0 lb

## 2020-12-14 DIAGNOSIS — R319 Hematuria, unspecified: Secondary | ICD-10-CM | POA: Diagnosis not present

## 2020-12-14 DIAGNOSIS — N921 Excessive and frequent menstruation with irregular cycle: Secondary | ICD-10-CM | POA: Diagnosis not present

## 2020-12-14 DIAGNOSIS — N941 Unspecified dyspareunia: Secondary | ICD-10-CM | POA: Diagnosis not present

## 2020-12-14 NOTE — Progress Notes (Signed)
Patient states she has been having irregular periods and feeling dizzy and lightheaded at times. Armandina Stammer RN

## 2020-12-14 NOTE — Progress Notes (Signed)
   Subjective:    Patient ID: Jacqueline Booker, female    DOB: 1996/02/27, 25 y.o.   MRN: [id number]  HPI Patient last seen on 3/3 for postpartum visit. Started on Ortho evra patches. Started bleeding end of march and had continued bleeding for 3 weeks. Took off patches and bleeding has decreased and is only spotting now.  Continues to have some pelvic pain, dyspareunia. Some pain towards the end of urination.    Review of Systems     Objective:   Physical Exam Vitals reviewed.  Constitutional:      Appearance: Normal appearance.  HENT:     Head: Normocephalic and atraumatic.  Abdominal:     General: Abdomen is flat. There is no distension.     Palpations: Abdomen is soft. There is no mass.     Tenderness: There is no abdominal tenderness. There is no guarding or rebound.     Hernia: No hernia is present.  Skin:    General: Skin is warm and dry.     Capillary Refill: Capillary refill takes less than 2 seconds.  Neurological:     General: No focal deficit present.     Mental Status: She is alert.  Psychiatric:        Mood and Affect: Mood normal.        Behavior: Behavior normal.        Thought Content: Thought content normal.        Judgment: Judgment normal.       Assessment & Plan:  1. Menorrhagia with irregular cycle Check CBC, TSH. Will try to restart patches. If continues to have menorrhagia on patches, may need alternative form of birth control - CBC - Thyroid Panel With TSH  2. Hematuria, unspecified type Check urine culture - Urine Culture  3. Dyspareunia in female Refer to pelvic floor PT.

## 2020-12-15 LAB — CBC
Hematocrit: 38.6 % (ref 34.0–46.6)
Hemoglobin: 12.5 g/dL (ref 11.1–15.9)
MCH: 27.4 pg (ref 26.6–33.0)
MCHC: 32.4 g/dL (ref 31.5–35.7)
MCV: 85 fL (ref 79–97)
Platelets: 333 10*3/uL (ref 150–450)
RBC: 4.57 x10E6/uL (ref 3.77–5.28)
RDW: 14.8 % (ref 11.7–15.4)
WBC: 4.2 10*3/uL (ref 3.4–10.8)

## 2020-12-15 LAB — THYROID PANEL WITH TSH
Free Thyroxine Index: 2.8 (ref 1.2–4.9)
T3 Uptake Ratio: 23 % — ABNORMAL LOW (ref 24–39)
T4, Total: 12.3 ug/dL — ABNORMAL HIGH (ref 4.5–12.0)
TSH: 1.65 u[IU]/mL (ref 0.450–4.500)

## 2020-12-16 LAB — URINE CULTURE

## 2021-02-15 ENCOUNTER — Ambulatory Visit: Payer: Medicaid Other | Admitting: Family Medicine

## 2021-09-10 ENCOUNTER — Encounter: Payer: Self-pay | Admitting: General Practice

## 2021-11-14 ENCOUNTER — Other Ambulatory Visit: Payer: Self-pay | Admitting: Family Medicine

## 2022-01-13 IMAGING — US US MFM OB COMP +14 WKS
1 series · 13 of 28 positions shown · non-contrast
Comparison: none

[Series 1: us mfm ob comp +14 wks · 147 acquisitions, 13 frames shown]
[im 6/147]
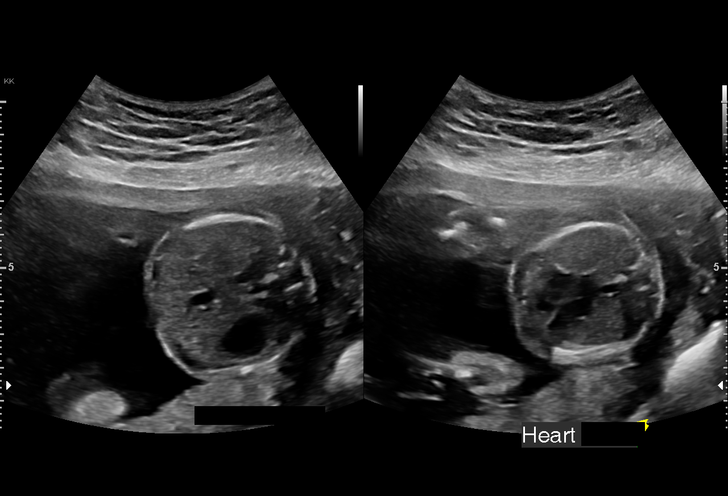
[im 17/147]
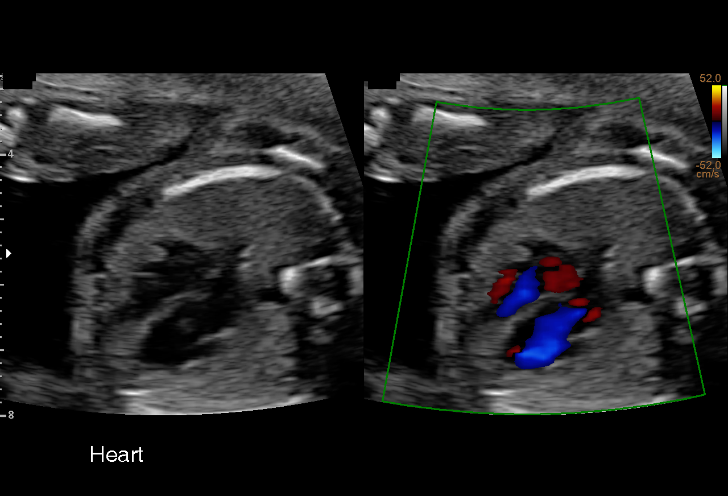
[im 28/147]
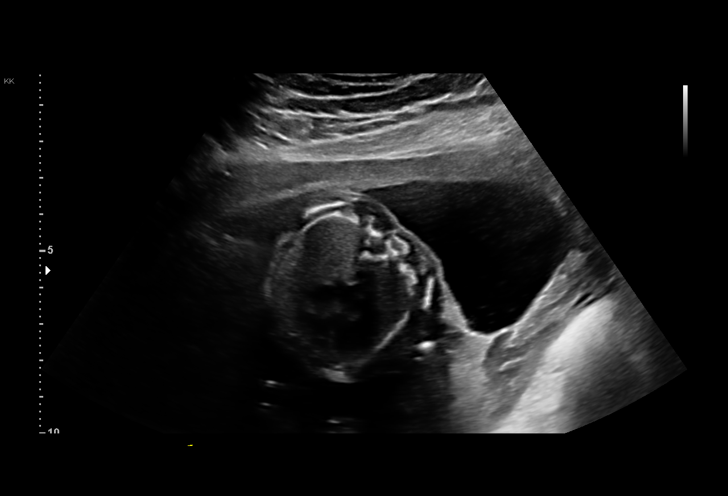
[im 38/147]
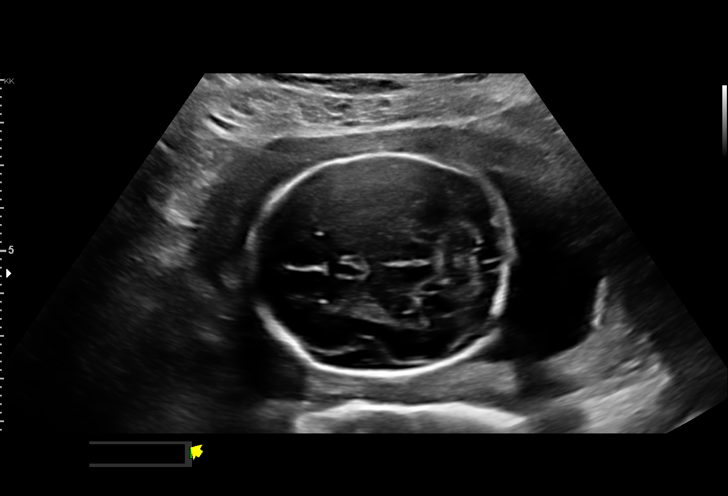
[im 49/147]
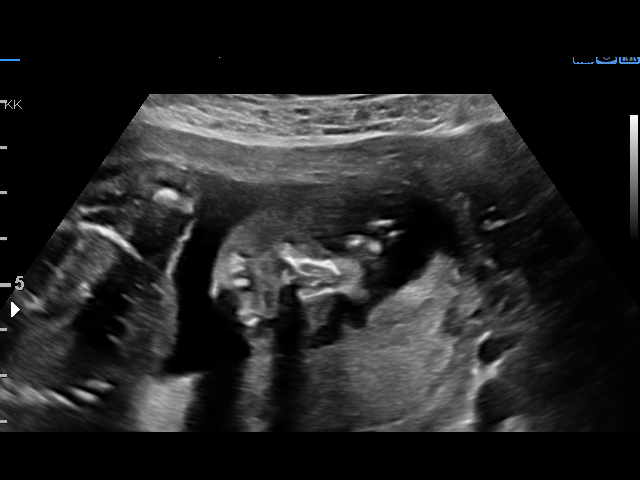
[im 60/147]
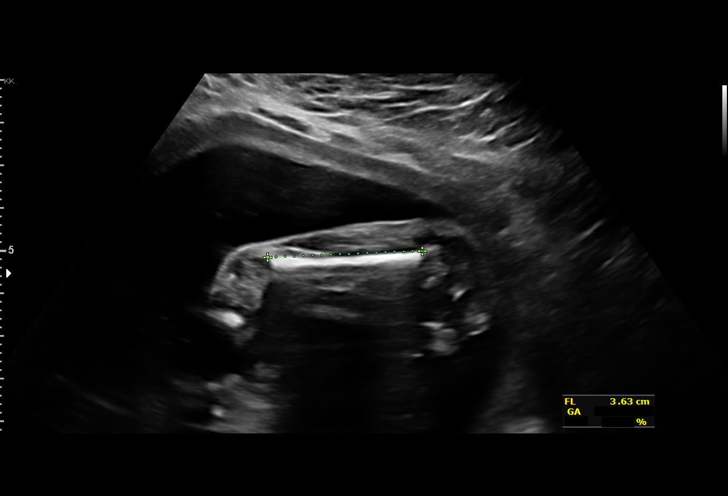
[im 76/147]
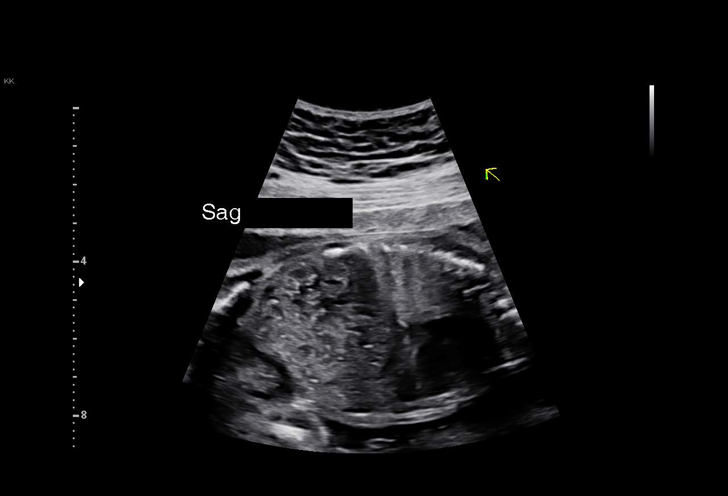
[im 87/147]
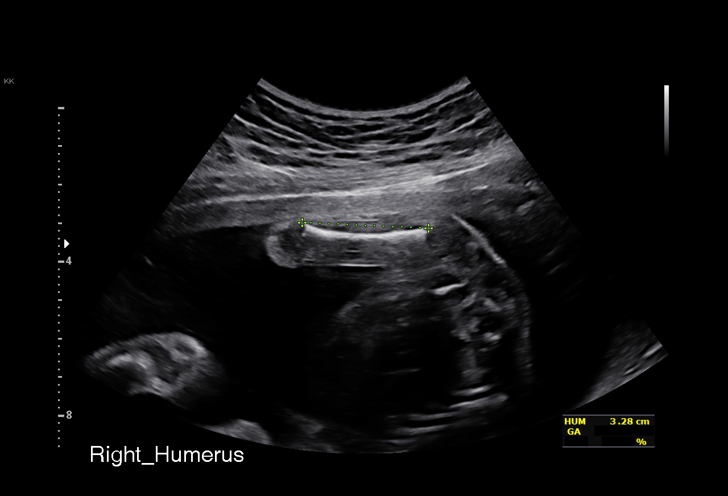
[im 98/147]
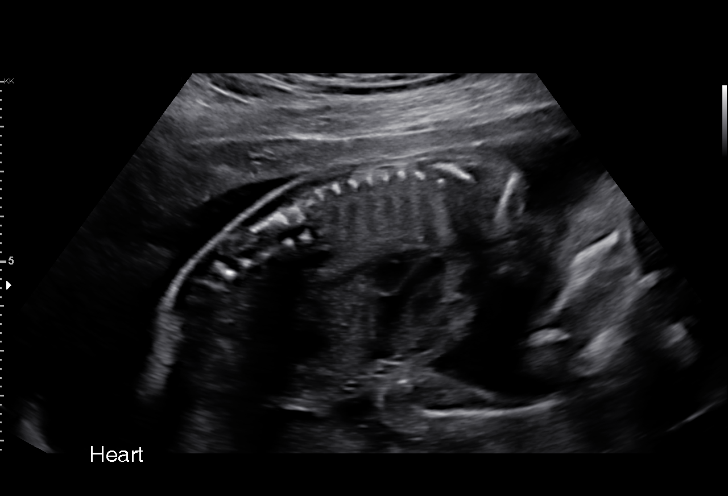
[im 109/147]
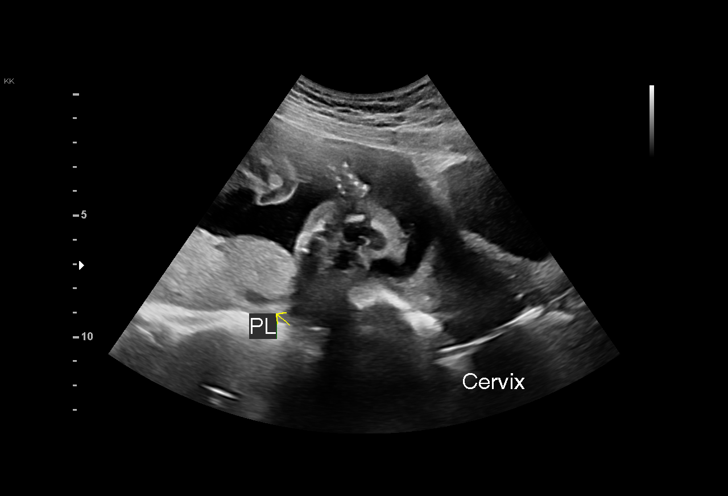
[im 119/147]
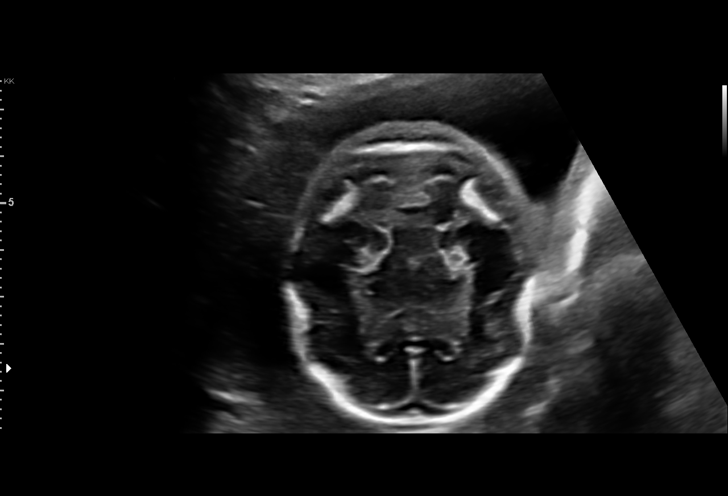
[im 130/147]
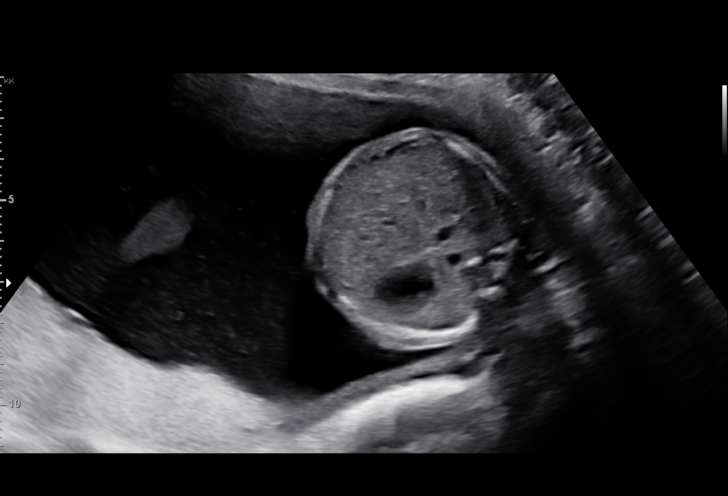
[im 141/147]
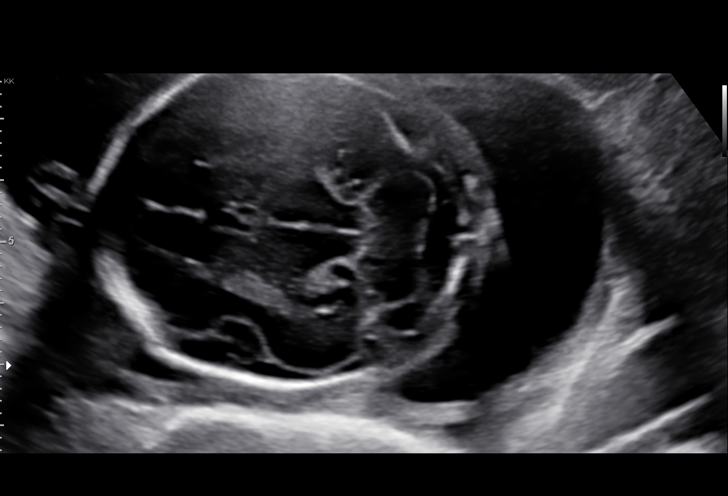

[13 of 28 positions shown; findings below may reference images not displayed]

Indications

 Encounter for antenatal screening for
 malformations
 Negative AFP(Low Risk NIPS)
 21 weeks gestation of pregnancy
Fetal Evaluation

 Num Of Fetuses:         1
 Fetal Heart Rate(bpm):  143
 Cardiac Activity:       Observed
 Presentation:           Cephalic
 Placenta:               Posterior
 P. Cord Insertion:      Visualized

 Amniotic Fluid
 AFI FV:      Within normal limits

                             Largest Pocket(cm)

Biometry

 BPD:      52.4  mm     G. Age:  21w 6d         73  %    CI:        76.58   %    70 - 86
                                                         FL/HC:      18.9   %    15.9 -
 HC:      189.7  mm     G. Age:  21w 2d         38  %    HC/AC:      1.15        1.06 -
 AC:      164.9  mm     G. Age:  21w 4d         51  %    FL/BPD:     68.5   %
 FL:       35.9  mm     G. Age:  21w 3d         44  %    FL/AC:      21.8   %    20 - 24
 HUM:      32.8  mm     G. Age:  21w 0d         41  %
 Est. FW:     426  gm    0 lb 15 oz      54  %
OB History

 Gravidity:    1
Gestational Age

 LMP:           19w 3d        Date:  12/26/19                 EDD:   10/01/20
 U/S Today:     21w 4d                                        EDD:   09/16/20
 Best:          21w 2d     Det. By:  Previous Ultrasound      EDD:   09/18/20
                                     (04/06/20)
Anatomy

 Cranium:               Appears normal         Aortic Arch:            Appears normal
 Cavum:                 Not well visualized    Ductal Arch:            Not well visualized
 Ventricles:            Appears normal         Diaphragm:              Appears normal
 Choroid Plexus:        Appears normal         Stomach:                Appears normal, left
                                                                       sided
 Cerebellum:            Appears normal         Abdomen:                Appears normal
 Posterior Fossa:       Appears normal         Abdominal Wall:         Appears nml (cord
                                                                       insert, abd wall)
 Nuchal Fold:           Not applicable (>20    Cord Vessels:           Appears normal (3
                        wks GA)                                        vessel cord)
 Face:                  Appears normal         Kidneys:                Appear normal
                        (orbits and profile)
 Lips:                  Appears normal         Bladder:                Appears normal
 Thoracic:              Appears normal         Spine:                  Appears normal
 Heart:                 Not well visualized    Upper Extremities:      Appears normal
 RVOT:                  Not well visualized    Lower Extremities:      Appears normal
 LVOT:                  Not well visualized

 Other:  Heels visualized. Technically difficult due to fetal position.
Cervix Uterus Adnexa

 Cervix
 Length:           3.26  cm.
 Normal appearance by transabdominal scan.
Comments

 This patient was seen for a detailed fetal anatomy scan.
 She denies any significant past medical history and denies
 any problems in her current pregnancy.
 She had a cell free DNA test earlier in her pregnancy which
 indicated a low risk for trisomy 21, 18, and 13. A male fetus is
 predicted.
 She was informed that the fetal growth and amniotic fluid
 level were appropriate for her gestational age.
 There were no obvious fetal anomalies noted on today's
 ultrasound exam.  However, the views of the fetal anatomy
 were limited today due to the fetal position.
 The patient was informed that anomalies may be missed due
 to technical limitations. If the fetus is in a suboptimal position
 or maternal habitus is increased, visualization of the fetus in
 the maternal uterus may be impaired.
 A follow-up exam was scheduled in 4 weeks to complete the
 views of the fetal anatomy.

## 2022-12-09 ENCOUNTER — Other Ambulatory Visit: Payer: Self-pay | Admitting: Family Medicine

## 2023-02-28 DIAGNOSIS — R300 Dysuria: Secondary | ICD-10-CM | POA: Diagnosis not present

## 2023-02-28 DIAGNOSIS — N390 Urinary tract infection, site not specified: Secondary | ICD-10-CM | POA: Diagnosis not present

## 2023-03-18 DIAGNOSIS — F411 Generalized anxiety disorder: Secondary | ICD-10-CM | POA: Diagnosis not present

## 2023-03-18 DIAGNOSIS — Z6828 Body mass index (BMI) 28.0-28.9, adult: Secondary | ICD-10-CM | POA: Diagnosis not present

## 2023-06-22 DIAGNOSIS — N3091 Cystitis, unspecified with hematuria: Secondary | ICD-10-CM | POA: Diagnosis not present

## 2023-09-30 DIAGNOSIS — R519 Headache, unspecified: Secondary | ICD-10-CM | POA: Diagnosis not present

## 2023-09-30 DIAGNOSIS — R070 Pain in throat: Secondary | ICD-10-CM | POA: Diagnosis not present

## 2023-09-30 DIAGNOSIS — R0981 Nasal congestion: Secondary | ICD-10-CM | POA: Diagnosis not present

## 2024-01-11 DIAGNOSIS — N3090 Cystitis, unspecified without hematuria: Secondary | ICD-10-CM | POA: Diagnosis not present

## 2024-01-11 DIAGNOSIS — R300 Dysuria: Secondary | ICD-10-CM | POA: Diagnosis not present

## 2024-02-20 DIAGNOSIS — R300 Dysuria: Secondary | ICD-10-CM | POA: Diagnosis not present

## 2024-02-20 DIAGNOSIS — N3001 Acute cystitis with hematuria: Secondary | ICD-10-CM | POA: Diagnosis not present

## 2024-02-20 DIAGNOSIS — Z6828 Body mass index (BMI) 28.0-28.9, adult: Secondary | ICD-10-CM | POA: Diagnosis not present

## 2024-02-26 ENCOUNTER — Other Ambulatory Visit: Payer: Self-pay | Admitting: Family Medicine

## 2024-04-15 NOTE — Progress Notes (Incomplete)
   28 y.o. G55P1011 female here for annual exam. Single.  No LMP recorded. (Menstrual status: Irregular Periods).    She reports ***. Urine sample provided: ***  Abnormal bleeding: *** Pelvic discharge or pain: *** Breast mass, nipple discharge or skin changes : ***  Sexually active: ***  Birth control: Patch Last PAP:     Component Value Date/Time   DIAGPAP (A) 04/06/2020 1411    - Atypical squamous cells of undetermined significance (ASC-US )   HPVHIGH Negative 04/06/2020 1411   ADEQPAP  04/06/2020 1411    Satisfactory for evaluation; transformation zone component PRESENT.   Gardasil: ***  Exercising: *** Smoker: ***       GYN HISTORY: ***  OB History  Gravida Para Term Preterm AB Living  2 1 1  1 1   SAB IAB Ectopic Multiple Live Births  1   0 1    # Outcome Date GA Lbr Len/2nd Weight Sex Type Anes PTL Lv  2 Term 09/20/20 [redacted]w[redacted]d 17:31 / 00:38 6 lb 7.2 oz (2.925 kg) M Vag-Spont EPI  LIV  1 SAB            No past medical history on file. No past surgical history on file. Current Outpatient Medications on File Prior to Visit  Medication Sig Dispense Refill   norelgestromin -ethinyl estradiol  (XULANE) 150-35 MCG/24HR transdermal patch PLACE 1 PATCH ONTO THE SKIN ONCE A WEEK. FOR USE CONTINUALLY FOR MENSTRUAL SUPPRESSION. 9 patch 5   No current facility-administered medications on file prior to visit.   Social History   Socioeconomic History   Marital status: Single    Spouse name: Not on file   Number of children: Not on file   Years of education: Not on file   Highest education level: Not on file  Occupational History   Not on file  Tobacco Use   Smoking status: Never   Smokeless tobacco: Never  Vaping Use   Vaping status: Never Used  Substance and Sexual Activity   Alcohol use: Never   Drug use: Never   Sexual activity: Yes    Birth control/protection: None  Other Topics Concern   Not on file  Social History Narrative   Not on file   Social  Drivers of Health   Financial Resource Strain: Not on file  Food Insecurity: Not on file  Transportation Needs: Not on file  Physical Activity: Not on file  Stress: Not on file  Social Connections: Not on file  Intimate Partner Violence: Not on file   Family History  Problem Relation Age of Onset   High Cholesterol Mother    High Cholesterol Father    No Known Allergies  PE There were no vitals filed for this visit. There is no height or weight on file to calculate BMI.  Physical Exam    Assessment and Plan:        There are no diagnoses linked to this encounter. Clotilda FORBES Pa, CMA

## 2024-04-19 ENCOUNTER — Encounter: Admitting: Obstetrics and Gynecology

## 2024-05-24 DIAGNOSIS — K644 Residual hemorrhoidal skin tags: Secondary | ICD-10-CM | POA: Diagnosis not present

## 2024-05-24 DIAGNOSIS — Z124 Encounter for screening for malignant neoplasm of cervix: Secondary | ICD-10-CM | POA: Diagnosis not present

## 2024-05-24 DIAGNOSIS — Z01419 Encounter for gynecological examination (general) (routine) without abnormal findings: Secondary | ICD-10-CM | POA: Diagnosis not present

## 2024-05-24 DIAGNOSIS — Z113 Encounter for screening for infections with a predominantly sexual mode of transmission: Secondary | ICD-10-CM | POA: Diagnosis not present
